# Patient Record
Sex: Male | Born: 1960 | Race: Black or African American | Hispanic: No | Marital: Single | Smoking: Never smoker
Health system: Southern US, Community
[De-identification: ages and names within clinical notes are randomized; demographics above are authoritative.]

## PROBLEM LIST (undated history)

## (undated) DIAGNOSIS — I10 Essential (primary) hypertension: Secondary | ICD-10-CM

## (undated) DIAGNOSIS — I519 Heart disease, unspecified: Secondary | ICD-10-CM

## (undated) DIAGNOSIS — B009 Herpesviral infection, unspecified: Secondary | ICD-10-CM

## (undated) DIAGNOSIS — Z973 Presence of spectacles and contact lenses: Secondary | ICD-10-CM

## (undated) DIAGNOSIS — E785 Hyperlipidemia, unspecified: Secondary | ICD-10-CM

## (undated) HISTORY — DX: Hyperlipidemia, unspecified: E78.5

## (undated) HISTORY — DX: Heart disease, unspecified: I51.9

## (undated) HISTORY — PX: WISDOM TOOTH EXTRACTION: SHX21

## (undated) HISTORY — PX: COLONOSCOPY: SHX174

## (undated) HISTORY — DX: Herpesviral infection, unspecified: B00.9

---
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
## 1997-09-16 HISTORY — PX: HERNIA REPAIR: SHX51

## 1997-12-15 ENCOUNTER — Encounter: Admission: RE | Admit: 1997-12-15 | Discharge: 1998-03-15 | Payer: Self-pay | Admitting: *Deleted

## 1998-01-05 ENCOUNTER — Ambulatory Visit (HOSPITAL_COMMUNITY): Admission: RE | Admit: 1998-01-05 | Discharge: 1998-01-05 | Payer: Self-pay | Admitting: *Deleted

## 1998-01-16 ENCOUNTER — Other Ambulatory Visit: Admission: RE | Admit: 1998-01-16 | Discharge: 1998-01-16 | Payer: Self-pay | Admitting: Hematology and Oncology

## 1998-03-06 ENCOUNTER — Other Ambulatory Visit: Admission: RE | Admit: 1998-03-06 | Discharge: 1998-03-06 | Payer: Self-pay | Admitting: Hematology and Oncology

## 1998-03-27 ENCOUNTER — Other Ambulatory Visit: Admission: RE | Admit: 1998-03-27 | Discharge: 1998-03-27 | Payer: Self-pay | Admitting: Hematology and Oncology

## 1998-05-27 ENCOUNTER — Encounter: Admission: RE | Admit: 1998-05-27 | Discharge: 1998-08-25 | Payer: Self-pay | Admitting: *Deleted

## 1998-05-31 ENCOUNTER — Inpatient Hospital Stay (HOSPITAL_COMMUNITY): Admission: EM | Admit: 1998-05-31 | Discharge: 1998-06-01 | Payer: Self-pay | Admitting: Emergency Medicine

## 1998-05-31 ENCOUNTER — Encounter: Payer: Self-pay | Admitting: Emergency Medicine

## 1999-08-06 ENCOUNTER — Emergency Department (HOSPITAL_COMMUNITY): Admission: EM | Admit: 1999-08-06 | Discharge: 1999-08-06 | Payer: Self-pay | Admitting: Emergency Medicine

## 2010-04-17 ENCOUNTER — Emergency Department (HOSPITAL_BASED_OUTPATIENT_CLINIC_OR_DEPARTMENT_OTHER): Admission: EM | Admit: 2010-04-17 | Discharge: 2010-04-18 | Payer: Self-pay | Admitting: Emergency Medicine

## 2010-04-17 ENCOUNTER — Ambulatory Visit: Payer: Self-pay | Admitting: Diagnostic Radiology

## 2010-04-18 ENCOUNTER — Ambulatory Visit: Payer: Self-pay | Admitting: Diagnostic Radiology

## 2010-11-30 LAB — DIFFERENTIAL
Eosinophils Absolute: 0.1 10*3/uL (ref 0.0–0.7)
Lymphocytes Relative: 32 % (ref 12–46)
Lymphs Abs: 1.9 10*3/uL (ref 0.7–4.0)
Monocytes Relative: 8 % (ref 3–12)
Neutrophils Relative %: 58 % (ref 43–77)

## 2010-11-30 LAB — CBC
HCT: 45.3 % (ref 39.0–52.0)
Hemoglobin: 15.3 g/dL (ref 13.0–17.0)
MCH: 30.3 pg (ref 26.0–34.0)
MCHC: 33.8 g/dL (ref 30.0–36.0)
MCV: 89.7 fL (ref 78.0–100.0)
Platelets: 186 10*3/uL (ref 150–400)
RBC: 5.05 MIL/uL (ref 4.22–5.81)
RDW: 13.3 % (ref 11.5–15.5)
WBC: 5.9 10*3/uL (ref 4.0–10.5)

## 2010-11-30 LAB — BASIC METABOLIC PANEL
BUN: 19 mg/dL (ref 6–23)
CO2: 27 mEq/L (ref 19–32)
Calcium: 10.3 mg/dL (ref 8.4–10.5)
Chloride: 105 mEq/L (ref 96–112)
Creatinine, Ser: 1.3 mg/dL (ref 0.4–1.5)
GFR calc Af Amer: >60 mL/min (ref >60)
GFR calc non Af Amer: 59 mL/min — ABNORMAL LOW (ref >60)
Glucose, Bld: 109 mg/dL — ABNORMAL HIGH (ref 70–99)
Potassium: 4.2 mEq/L (ref 3.5–5.1)
Sodium: 140 mEq/L (ref 135–145)

## 2010-11-30 LAB — POCT CARDIAC MARKERS
CKMB, poc: 2.7 ng/mL (ref 1.0–8.0)
Myoglobin, poc: 169 ng/mL (ref 12–200)
Myoglobin, poc: 188 ng/mL (ref 12–200)
Troponin i, poc: <0.05 ng/mL (ref 0.00–0.09)

## 2012-11-18 ENCOUNTER — Emergency Department (HOSPITAL_BASED_OUTPATIENT_CLINIC_OR_DEPARTMENT_OTHER)
Admission: EM | Admit: 2012-11-18 | Discharge: 2012-11-18 | Disposition: A | Discharge status: Home / Self Care | Payer: BC Managed Care – PPO | Attending: Emergency Medicine | Admitting: Emergency Medicine

## 2012-11-18 ENCOUNTER — Encounter (HOSPITAL_BASED_OUTPATIENT_CLINIC_OR_DEPARTMENT_OTHER): Payer: Self-pay | Admitting: *Deleted

## 2012-11-18 ENCOUNTER — Emergency Department (HOSPITAL_BASED_OUTPATIENT_CLINIC_OR_DEPARTMENT_OTHER): Payer: BC Managed Care – PPO

## 2012-11-18 DIAGNOSIS — I10 Essential (primary) hypertension: Secondary | ICD-10-CM | POA: Insufficient documentation

## 2012-11-18 DIAGNOSIS — R5381 Other malaise: Secondary | ICD-10-CM | POA: Insufficient documentation

## 2012-11-18 DIAGNOSIS — H539 Unspecified visual disturbance: Secondary | ICD-10-CM | POA: Insufficient documentation

## 2012-11-18 DIAGNOSIS — G510 Bell's palsy: Secondary | ICD-10-CM

## 2012-11-18 HISTORY — DX: Essential (primary) hypertension: I10

## 2012-11-18 LAB — COMPREHENSIVE METABOLIC PANEL
AST: 24 U/L (ref 0–37)
CO2: 27 mEq/L (ref 19–32)
Calcium: 11.4 mg/dL — ABNORMAL HIGH (ref 8.4–10.5)
Creatinine, Ser: 1.4 mg/dL — ABNORMAL HIGH (ref 0.50–1.35)
GFR calc non Af Amer: 57 mL/min — ABNORMAL LOW (ref >90)
Sodium: 137 mEq/L (ref 135–145)
Total Protein: 8.1 g/dL (ref 6.0–8.3)

## 2012-11-18 LAB — PROTIME-INR: INR: 1 (ref 0.00–1.49)

## 2012-11-18 LAB — CBC WITH DIFFERENTIAL/PLATELET
Basophils Absolute: 0 10*3/uL (ref 0.0–0.1)
Basophils Relative: 0 % (ref 0–1)
Eosinophils Absolute: 0.1 10*3/uL (ref 0.0–0.7)
Eosinophils Relative: 2 % (ref 0–5)
HCT: 46.6 % (ref 39.0–52.0)
Lymphocytes Relative: 37 % (ref 12–46)
MCH: 30.6 pg (ref 26.0–34.0)
MCHC: 35.4 g/dL (ref 30.0–36.0)
MCV: 86.5 fL (ref 78.0–100.0)
Monocytes Absolute: 0.6 10*3/uL (ref 0.1–1.0)
Platelets: 197 10*3/uL (ref 150–400)
RDW: 13.7 % (ref 11.5–15.5)

## 2012-11-18 LAB — TROPONIN I: Troponin I: <0.3 ng/mL (ref <0.30)

## 2012-11-18 LAB — APTT: aPTT: 31 seconds (ref 24–37)

## 2012-11-18 MED ORDER — ARTIFICIAL TEARS OP OINT: TOPICAL_OINTMENT | Freq: Three times a day (TID) | OPHTHALMIC | Status: DC

## 2012-11-18 MED ORDER — PREDNISONE 50 MG PO TABS: 50.0000 mg | ORAL_TABLET | Freq: Every day | ORAL | Status: DC

## 2012-11-18 MED ORDER — ACYCLOVIR 400 MG PO TABS: 400.0000 mg | ORAL_TABLET | Freq: Four times a day (QID) | ORAL | Status: DC

## 2012-11-18 NOTE — ED Provider Notes (Signed)
History     CSN: [id number]  Arrival date & time 11/18/12  1342   First MD Initiated Contact with Patient 11/18/12 1504      Chief Complaint  Patient presents with  . Numbness    (Consider location/radiation/quality/duration/timing/severity/associated sxs/prior treatment) HPI Pt p/w numbness and weakness to R face starting yesterday morning at 0600 upon waking. States was at his baseline when he went to bed. Pt states that he has had some mild R eye blurred vision and tingling in the tongue. No recent illness, no fever of chills, no ext weakness or numbness. Seen at urgent care center and sent for further eval.  Past Medical History  Diagnosis Date  . Hypertension     Past Surgical History  Procedure Laterality Date  . Hernia repair    . Eye surgery    . Wisdom tooth extraction      No family history on file.  History  Substance Use Topics  . Smoking status: Never Smoker   . Smokeless tobacco: Never Used  . Alcohol Use: 1.0 oz/week    2 drink(s) per week      Review of Systems  Constitutional: Negative for fever and chills.  HENT: Negative for ear pain, sore throat, trouble swallowing, voice change and tinnitus.   Eyes: Positive for visual disturbance. Negative for photophobia and pain.  Respiratory: Negative for cough and shortness of breath.   Cardiovascular: Negative for chest pain, palpitations and leg swelling.  Gastrointestinal: Negative for nausea, vomiting and abdominal pain.  Musculoskeletal: Negative for back pain.  Skin: Negative for rash and wound.  Neurological: Positive for facial asymmetry, weakness and numbness. Negative for speech difficulty and headaches.    Allergies  Review of patient's allergies indicates no known allergies.  Home Medications   Current Outpatient Rx  Name  Route  Sig  Dispense  Refill  . ibuprofen (ADVIL,MOTRIN) 800 MG tablet   Oral   Take 800 mg by mouth every 8 (eight) hours as needed for pain.         Marland Kitchen  acyclovir (ZOVIRAX) 400 MG tablet   Oral   Take 1 tablet (400 mg total) by mouth 4 (four) times daily.   40 tablet   0   . artificial tears (LACRILUBE) OINT ophthalmic ointment   Ophthalmic   Apply to eye every 8 (eight) hours.   1 Tube   0   . predniSONE (DELTASONE) 50 MG tablet   Oral   Take 1 tablet (50 mg total) by mouth daily.   5 tablet   0     BP 176/103  Pulse 62  Temp(Src) 98.2 F (36.8 C)  Resp 18  Ht 6\' 5"  (1.956 m)  Wt 317 lb (143.79 kg)  BMI 37.58 kg/m2  SpO2 100%  Physical Exam  Nursing note and vitals reviewed. Constitutional: He is oriented to person, place, and time. He appears well-developed and well-nourished. No distress.  HENT:  Head: Normocephalic and atraumatic.  Mouth/Throat: Oropharynx is clear and moist.  Eyes: EOM are normal. Pupils are equal, round, and reactive to light.  Neck: Normal range of motion. Neck supple.  No meningismus   Cardiovascular: Normal rate and regular rhythm.   Pulmonary/Chest: Effort normal and breath sounds normal. No respiratory distress. He has no wheezes. He has no rales.  Abdominal: Soft. Bowel sounds are normal. He exhibits no distension and no mass. There is no tenderness. There is no rebound and no guarding.  Musculoskeletal: Normal range of motion.  He exhibits no edema and no tenderness.  Neurological: He is alert and oriented to person, place, and time.  Mild R nasolabial fold flattening, R orbicularis oculi weakness, and R frontalis weakness. Pt has decreased sensation of upper and lower face. +R eye medial gaze deficit which pt states is previously present. 5/5 motor in all ext, sensation intact, finger to nose intact bl. Ambulating without difficulty.   Skin: Skin is warm and dry. No rash noted. No erythema.  Psychiatric: He has a normal mood and affect. His behavior is normal.    ED Course  Procedures (including critical care time)  Labs Reviewed  COMPREHENSIVE METABOLIC PANEL - Abnormal; Notable for  the following:    Creatinine, Ser 1.40 (*)    Calcium 11.4 (*)    GFR calc non Af Amer 57 (*)    GFR calc Af Amer 66 (*)    All other components within normal limits  CBC WITH DIFFERENTIAL  PROTIME-INR  TROPONIN I  APTT   Ct Head Wo Contrast  11/18/2012  *RADIOLOGY REPORT*  Clinical Data: Right facial numbness since yesterday evening.  CT HEAD WITHOUT CONTRAST  Technique:  Contiguous axial images were obtained from the base of the skull through the vertex without contrast.  Comparison: MRI brain 04/18/2010.  CT head 04/17/2010.  Findings: There is no evidence for acute infarction, intracranial hemorrhage, mass lesion, hydrocephalus, or extra-axial fluid. There is no atrophy or white matter disease.  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 The larger lesion on image 12 measures 9 mm, and the smaller lesion on image 15 measures 4 mm.  Neither show acute hemorrhage.  These are incidental findings, unchanged from 2011.  Calvarium is intact.  Paranasal sinuses and mastoids are clear.  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 Otherwise negative exam.   Original Report Authenticated By: Davonna Belling, M.D.      1. Facial paralysis/Bells palsy      Date: 11/18/2012  Rate: 58  Rhythm: sinus bradycardia  QRS Axis: normal  Intervals: normal  ST/T Wave abnormalities: nonspecific T wave changes  Conduction Disutrbances:none  Narrative Interpretation:   Old EKG Reviewed: unchanged    MDM   Discussed finding with Dr Roseanne Reno. Agrees that this is a peripheral palsy. Will d/c home and advise neuro f/u for persistent symptoms. Return for concerns or worsening symptoms.        Loren Racer, MD 11/18/12 [phone]

## 2012-11-18 NOTE — ED Notes (Signed)
Pt able to ambulate with a steady gait, c/o pain behind right eye but "not as bad as yesterday".

## 2012-11-18 NOTE — ED Notes (Signed)
Patient transported to CT 

## 2012-11-18 NOTE — ED Notes (Signed)
Pt reports sharp pain right side of head upon waking yesterday- also reports pain in right eye, blurred vision and numbness right side of face- seen at [facility] today and sent here for CT and further eval

## 2012-11-18 NOTE — ED Notes (Signed)
EDP Zackowski updated on pt status

## 2012-11-18 NOTE — ED Notes (Signed)
MD at bedside. 

## 2012-11-18 NOTE — ED Notes (Signed)
Pt d/c home with family- directed to pharmacy to pick prescriptions- speech clear, cao x 4 at time of d/c

## 2013-03-08 ENCOUNTER — Other Ambulatory Visit: Payer: Self-pay | Admitting: Family Medicine

## 2013-03-08 DIAGNOSIS — R2232 Localized swelling, mass and lump, left upper limb: Secondary | ICD-10-CM

## 2013-03-16 ENCOUNTER — Ambulatory Visit
Admission: RE | Admit: 2013-03-16 | Discharge: 2013-03-16 | Disposition: A | Discharge status: Home / Self Care | Payer: BC Managed Care – PPO | Source: Ambulatory Visit | Attending: Family Medicine | Admitting: Family Medicine

## 2013-03-16 DIAGNOSIS — R2232 Localized swelling, mass and lump, left upper limb: Secondary | ICD-10-CM

## 2013-03-16 MED ORDER — GADOBENATE DIMEGLUMINE 529 MG/ML IV SOLN
20.0000 mL | Freq: Once | INTRAVENOUS | Status: AC | PRN
  Administered 2013-03-16: 20 mL via INTRAVENOUS

## 2013-03-29 ENCOUNTER — Ambulatory Visit (INDEPENDENT_AMBULATORY_CARE_PROVIDER_SITE_OTHER): Payer: BC Managed Care – PPO | Admitting: Surgery

## 2013-03-29 ENCOUNTER — Other Ambulatory Visit (INDEPENDENT_AMBULATORY_CARE_PROVIDER_SITE_OTHER): Payer: Self-pay

## 2013-03-29 ENCOUNTER — Encounter (INDEPENDENT_AMBULATORY_CARE_PROVIDER_SITE_OTHER): Payer: Self-pay | Admitting: Surgery

## 2013-03-29 VITALS — BP 138/88 | HR 60 | Temp 97.4°F | Ht 77.0 in | Wt 316.6 lb

## 2013-03-29 DIAGNOSIS — E210 Primary hyperparathyroidism: Secondary | ICD-10-CM

## 2013-03-29 NOTE — Patient Instructions (Signed)
Parathyroidectomy A parathyroidectomy is surgery to remove one or more parathyroid glands. These glands produce a hormone (parathyroid hormone) that helps control the level of calcium in your body. The glands are very small, about the size of a pea. They are located in your neck, close to your thyroid gland and your Adam's apple. Most people (85%) have four parathyroid glands,some people may have one or two more than that. Hyperparathyroidism is when too much parathyroid hormone is being produced. Usually this is caused by one of the parathyroid glands becoming enlarged, but it can also be caused by more than one of the glands. Hyperparathyroidism is found during blood tests that show high calcium in the blood. Parathyroid hormone levels will also be elevated. Cancer also can cause hyperparathyroidism, but this is rare. For the most common type of hyperparathyroidism, the treatment is surgical removal of the parathyroid gland that is enlarged. For patients with kidney failure and hyperparathyroidism, other treatment will be tried before surgery is done on the parathyroid.  Many times x-ray studies are done to find out which parathyroid gland or glands is malfunctioning. The decision about the best treatment for hyperparathyroidism is between the patient, their primary doctor, an endocrinologist, and a surgeon experienced in parathyroid surgery. LET YOUR CAREGIVER KNOW ABOUT:  Any allergies.  All medications you are taking, including:  Herbs, eyedrops, over-the-counter medications and creams.  Blood thinners (anticoagulants), aspirin or other drugs that could affect blood clotting.  Use of steroids (by mouth or as creams).  Previous problems with anesthetics, including local anesthetics.  Possibility of pregnancy, if this applies.  Any history of blood clots.  Any history of bleeding or other blood problems.  Previous surgery.  Smoking history.  Other health problems. RISKS AND  COMPLICATIONS   Short-term possibilities include:  Excessive bleeding.  Pain.  Infection near the incision.  Slow healing.  Pooling of blood under the wound (hematoma).  Damage to nerves in your neck.  Blood clots.  Difficulty breathing. This is very rare. It also is almost always temporary.  Longer-term possibilities include:  Scarring.  Skin damage.  Damage to blood vessels in the area.  Need for additional surgery.  A hoarse or weak voice. This is usually temporary. It can be the result of nerve damage.  Development of hypoparathyroidism. This means you are not making enough parathyroid hormone. It is rare. If it occurs, you will need to take calcium supplements daily. BEFORE THE PROCEDURE  Sometimes the surgery is done on an outpatient basis. This means you could go home the same day as your surgery. Other times, people need to stay in the hospital overnight. Ask your surgeon what you should expect.  If your surgery will be an outpatient procedure, arrange for someone to drive you home after the surgery.  Two weeks before your surgery, stop using aspirin and non-steroidal anti-inflammatory drugs (NSAID's) for pain relief. This includes prescription drugs and over-the-counter drugs such as ibuprofen and naproxen. Also stop taking vitamin E.  If you take blood-thinners, ask your healthcare provider when you should stop taking them.  Do not eat or drink for about 8 hours before your surgery.  You might be asked to shower or wash with a special antibacterial soap before the procedure.  Arrive at least an hour before the surgery, or whenever your surgeon recommends. This will give you time to check in and fill out any needed paperwork. PROCEDURE  The preparation:  You will change into a hospital gown.  You   will be given an IV. A needle will be inserted in your arm. Medication will be able to flow directly into your body through this needle.  You might be given a  sedative to help you relax.  You will be given a drug that puts you to sleep during the surgery (general anesthetic).  The procedure:  Once you are asleep, the surgeon will make a small cut (incision) in your lower neck. Ask your surgeon where the incision will be.  The surgeon will look for the gland(s) that are not working well. Often a tissue sample from a gland is used to determine this.  Any glands that are not working well will be removed.  The surgeon will close the incision with stitches, often these are hidden under the skin. AFTER THE PROCEDURE  You will stay in a recovery area until the anesthesia has worn off. Your blood pressure and heart rate will be checked.  If your surgery was an outpatient procedure, you will go home the same day.  If you need to stay in the hospital, you will be moved to a hospital room. You will probably stay for two to three days. This will depend on how quickly you recover.  While you are in the hospital, your blood will be tested to check the calcium levels in your body. HOME CARE INSTRUCTIONS   Take any medication that your surgeon prescribes. Follow the directions carefully. Take all of the medication.  Ask your surgeon whether you can take over-the-counter medicines for pain, discomfort or fever. Do not take aspirin without permission from the surgeon. Aspirin increases the chances of bleeding.  Do not get the wound wet for the first few days after surgery (or until the surgeon tells you it is OK).  After this procedure, many patients may develop low calcium levels in the blood. It is critical that you see your medical caregiver to have this monitored and managed.     SEEK MEDICAL CARE IF:   You notice blood or fluid leaking from the wound, or it becomes red or swollen.  You have trouble breathing.  You have trouble speaking.  You become nauseous or throw up for more than two days after the surgery.  You have a fever or  persistent symptoms for more than 2-3 days. SEEK IMMEDIATE MEDICAL CARE IF:   Breathing becomes more difficult.  You have a fever and your symptoms suddenly get worse. Document Released: 11/29/2008 Document Revised: 08/19/2012 Document Reviewed: 11/29/2008 Exit[employer] Patient Information 2014 [employer].  

## 2013-03-29 NOTE — Progress Notes (Signed)
General Surgery [facility]  Chief Complaint  Patient presents with  . New Evaluation    eval hyperparathyroidism - referral from Dr. Casimiro Needle Hilts    HISTORY: Patient is a 52 year old male referred by his primary physician for evaluation of suspected primary hyperparathyroidism. Patient was noted to have elevated serum calcium levels ranging from 11.1-11.4. An intact PTH level was elevated at 151. Patient had been treated in the past for low vitamin D levels. He has not had any further diagnostic testing.  Patient has had no prior head or neck surgery. There is no family history of parathyroid disease or other endocrine neoplasm.  Past Medical History  Diagnosis Date  . Hypertension   . Hyperlipidemia     Current Outpatient Prescriptions  Medication Sig Dispense Refill  . acyclovir (ZOVIRAX) 400 MG tablet Take 1 tablet (400 mg total) by mouth 4 (four) times daily.  40 tablet  0  . benazepril-hydrochlorthiazide (LOTENSIN HCT) 10-12.5 MG per tablet       . lisinopril (PRINIVIL,ZESTRIL) 10 MG tablet        No current facility-administered medications for this visit.    No Known Allergies  Family History  Problem Relation Age of Onset  . Heart failure Father   . Cancer Maternal Grandmother     Ovarian  . Cancer Paternal Grandmother     Colon    History   Social History  . Marital Status: Single    Spouse Name: N/A    Number of Children: N/A  . Years of Education: N/A   Social History Main Topics  . Smoking status: Never Smoker   . Smokeless tobacco: Never Used  . Alcohol Use: 1.0 oz/week    2 drink(s) per week     Comment: monthly  . Drug Use: No  . Sexually Active: None   Other Topics Concern  . None   Social History Narrative  . None    REVIEW OF SYSTEMS - PERTINENT POSITIVES ONLY: Denies tremor. Denies palpitations. Mild to moderate fatigue. Denies bone and joint pain. No history of nephrolithiasis.  EXAM: Filed Vitals:   03/29/13 1136  BP: 138/88  Pulse: 60  Temp: 97.4 F (36.3 C)    HEENT: normocephalic; pupils equal and reactive; sclerae clear; dentition good; mucous membranes moist NECK:  No palpable nodules in the thyroid bed; symmetric on extension; no palpable anterior or posterior cervical lymphadenopathy; no supraclavicular masses; no tenderness CHEST: clear to auscultation bilaterally without rales, rhonchi, or wheezes CARDIAC: regular rate and rhythm without significant murmur; peripheral pulses are full EXT:  non-tender without edema; no deformity NEURO: no gross focal deficits; no sign of tremor   LABORATORY RESULTS: See [facility] ([facility]Epic) for most recent results  RADIOLOGY RESULTS: See [facility] ([facility]L-Epic) for most recent results  IMPRESSION: Hypercalcemia, probable primary hyperparathyroidism  PLAN: Patient and I discussed hyperparathyroidism at length. I provided him with written literature to review. We are going to obtain a nuclear medicine parathyroid scan and I have asked the patient to do a 24-hour urine collection for calcium. Once these studies are completed, he will return to the office to review the results and did discuss minimally invasive surgery.  Velora Heckler, MD, FACS General & Endocrine Surgery [facility]  Primary Care Physician: Lillia Carmel, MD

## 2013-03-29 NOTE — Addendum Note (Signed)
Addended by: Joanette Gula on: 03/29/2013 12:26 PM   Modules accepted: Orders

## 2013-03-31 ENCOUNTER — Other Ambulatory Visit (INDEPENDENT_AMBULATORY_CARE_PROVIDER_SITE_OTHER): Payer: Self-pay | Admitting: Surgery

## 2013-04-01 ENCOUNTER — Encounter (HOSPITAL_COMMUNITY)
Admission: RE | Admit: 2013-04-01 | Discharge: 2013-04-01 | Disposition: A | Discharge status: Home / Self Care | Payer: BC Managed Care – PPO | Source: Ambulatory Visit | Attending: Surgery | Admitting: Surgery

## 2013-04-01 DIAGNOSIS — E210 Primary hyperparathyroidism: Secondary | ICD-10-CM | POA: Insufficient documentation

## 2013-04-01 LAB — CALCIUM, URINE, 24 HOUR: Calcium, 24 hour urine: 475 mg/d — ABNORMAL HIGH (ref 100–250)

## 2013-04-01 MED ORDER — TECHNETIUM TC 99M SESTAMIBI GENERIC - CARDIOLITE
26.0000 | Freq: Once | INTRAVENOUS | Status: AC | PRN
  Administered 2013-04-01: 26 via INTRAVENOUS

## 2013-04-07 ENCOUNTER — Ambulatory Visit (INDEPENDENT_AMBULATORY_CARE_PROVIDER_SITE_OTHER): Payer: BC Managed Care – PPO | Admitting: Surgery

## 2013-04-07 ENCOUNTER — Telehealth (INDEPENDENT_AMBULATORY_CARE_PROVIDER_SITE_OTHER): Payer: Self-pay

## 2013-04-07 ENCOUNTER — Encounter (INDEPENDENT_AMBULATORY_CARE_PROVIDER_SITE_OTHER): Payer: Self-pay | Admitting: Surgery

## 2013-04-07 VITALS — BP 170/110 | HR 64 | Temp 98.2°F | Resp 14 | Ht 77.0 in | Wt 322.0 lb

## 2013-04-07 DIAGNOSIS — E210 Primary hyperparathyroidism: Secondary | ICD-10-CM

## 2013-04-07 NOTE — Progress Notes (Signed)
General Surgery - [facility]  Visit Diagnoses: 1. Hyperparathyroidism, primary     HISTORY: Patient is a 52-year-old male being evaluated for primary hyperparathyroidism. At my request he underwent a 24-hour urine collection for calcium. This was markedly elevated at 475 mg per day. This was consistent with primary hyperparathyroidism. Patient underwent nuclear medicine parathyroid scan on 04/01/2013. 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 Patient returns today to discuss these results and plan for surgical resection.  PERTINENT REVIEW OF SYSTEMS: See previous note  EXAM: HEENT: normocephalic; pupils equal and reactive; sclerae clear; dentition good; mucous membranes moist NECK:  No palpable nodularity in the thyroid bed; symmetric on extension; no palpable anterior or posterior cervical lymphadenopathy; no supraclavicular masses; no tenderness CHEST: clear to auscultation bilaterally without rales, rhonchi, or wheezes CARDIAC: regular rate and rhythm without significant murmur; peripheral pulses are full EXT:  non-tender without edema; no deformity NEURO: no gross focal deficits; no sign of tremor   IMPRESSION: Primary hyperparathyroidism, 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 We discussed the procedure of minimally invasive parathyroidectomy. We discussed the alternative procedure of neck exploration with 4 gland identification. We discussed the risk and benefits of each procedure. We discussed the possibility of a second gland adenoma and additional surgery. We discussed restrictions on his activities following the procedure.  I believe the patient is a good candidate for minimally invasive surgery. This can be performed as an outpatient. Patient understands and wishes to proceed. We will arrange for his procedure in the near future.  The risks and benefits of the procedure have  been discussed at length with the patient.  The patient understands the proposed procedure, potential alternative treatments, and the course of recovery to be expected.  All of the patient's questions have been answered at this time.  The patient wishes to proceed with surgery.  Glenn Christo M. Malene Blaydes, MD, FACS [facility] Office: [phone]    

## 2013-04-07 NOTE — Telephone Encounter (Signed)
Pt given my chart info. States he will sign up.

## 2013-04-07 NOTE — Patient Instructions (Signed)

## 2013-04-19 ENCOUNTER — Encounter (HOSPITAL_COMMUNITY): Payer: Self-pay

## 2013-04-26 ENCOUNTER — Other Ambulatory Visit: Payer: Self-pay | Admitting: Orthopedic Surgery

## 2013-04-26 NOTE — Pre-Procedure Instructions (Addendum)
Nathan Butler.  04/26/2013   Your procedure is scheduled on:  Tuesday, August 19th.  Report to [facility] at 10:15 AM.  Call this number if you have problems the morning of surgery: [phone]   Remember:   Do not eat food or drink liquids after midnight.   Take these medicines the morning of surgery with A SIP OF WATER: None  Stop taking Aspirin, Coumadin, Plavix, Effient and Herbal medications.  Do not take any NSAIDs ie: Ibuprofen,  Advil,Naproxen or any medication containing Aspirin.   Do not wear jewelry, make-up or nail polish.  Do not wear lotions, powders, or perfumes. You may  NOT wear deodorant.  Men may shave face and neck.  Do not bring valuables to the hospital.  [facility] is not responsible  for any belongings or valuables.  Contacts, dentures or bridgework may not be worn into surgery.  Leave suitcase in the car. After surgery it may be brought to your room.  For patients admitted to the hospital, checkout time is 11:00 AM the day of discharge.   Patients discharged the day of surgery will not be allowed to drive home.  Name and phone number of your driver: -   Special Instructions: Shower using CHG 2 nights before surgery and the night before surgery.  If you shower the day of surgery use CHG.  Use special wash - you have one bottle of CHG for all showers.  You should use approximately 1/3 of the bottle for each shower.   Please read over the following fact sheets that you were given: Pain Booklet, Coughing and Deep Breathing and Surgical Site Infection Prevention

## 2013-04-27 ENCOUNTER — Encounter (HOSPITAL_COMMUNITY): Payer: Self-pay

## 2013-04-27 ENCOUNTER — Encounter (HOSPITAL_COMMUNITY)
Admission: RE | Admit: 2013-04-27 | Discharge: 2013-04-27 | Disposition: A | Discharge status: Home / Self Care | Payer: BC Managed Care – PPO | Source: Ambulatory Visit | Attending: Surgery | Admitting: Surgery

## 2013-04-27 DIAGNOSIS — Z01818 Encounter for other preprocedural examination: Secondary | ICD-10-CM | POA: Insufficient documentation

## 2013-04-27 DIAGNOSIS — Z01812 Encounter for preprocedural laboratory examination: Secondary | ICD-10-CM | POA: Insufficient documentation

## 2013-04-27 LAB — COMPREHENSIVE METABOLIC PANEL
Alkaline Phosphatase: 67 U/L (ref 39–117)
BUN: 15 mg/dL (ref 6–23)
Chloride: 102 mEq/L (ref 96–112)
Creatinine, Ser: 1.39 mg/dL — ABNORMAL HIGH (ref 0.50–1.35)
GFR calc Af Amer: 66 mL/min — ABNORMAL LOW (ref >90)
Glucose, Bld: 97 mg/dL (ref 70–99)
Potassium: 4.4 mEq/L (ref 3.5–5.1)
Total Bilirubin: 0.3 mg/dL (ref 0.3–1.2)

## 2013-04-27 LAB — CBC
HCT: 47.2 % (ref 39.0–52.0)
Hemoglobin: 17 g/dL (ref 13.0–17.0)
MCHC: 36 g/dL (ref 30.0–36.0)
MCV: 84.9 fL (ref 78.0–100.0)
WBC: 7.6 10*3/uL (ref 4.0–10.5)

## 2013-04-27 NOTE — Progress Notes (Addendum)
Saw a cardio about 1-2 yr ago...had tests on heart--stress- per Dr. York Spaniel....having palpitations and was on BP meds at time...tests came out normal( inside chart).....but with current dx, he's having rare palpitations d/t hi calcium.

## 2013-04-30 NOTE — Progress Notes (Signed)
Quick Note:  These results are acceptable for scheduled surgery.  Loyce Klasen M. Egidio Lofgren, MD, FACS [facility] Office: [phone]   ______ 

## 2013-05-03 MED ORDER — DEXTROSE 5 % IV SOLN
3.0000 g | INTRAVENOUS | Status: AC
  Administered 2013-05-04: 3 g via INTRAVENOUS
  Filled 2013-05-03: qty 3000

## 2013-05-03 MED ORDER — CHLORHEXIDINE GLUCONATE 4 % EX LIQD: 60.0000 mL | Freq: Once | CUTANEOUS | Status: DC

## 2013-05-04 ENCOUNTER — Ambulatory Visit (HOSPITAL_COMMUNITY)
Admission: RE | Admit: 2013-05-04 | Discharge: 2013-05-04 | Disposition: A | Discharge status: Home / Self Care | Payer: BC Managed Care – PPO | Source: Ambulatory Visit | Attending: Surgery | Admitting: Surgery

## 2013-05-04 ENCOUNTER — Encounter (HOSPITAL_COMMUNITY): Payer: Self-pay | Admitting: Anesthesiology

## 2013-05-04 ENCOUNTER — Encounter (HOSPITAL_COMMUNITY): Payer: Self-pay

## 2013-05-04 ENCOUNTER — Encounter (HOSPITAL_COMMUNITY)
Admission: RE | Disposition: A | Discharge status: Home / Self Care | Payer: Self-pay | Source: Ambulatory Visit | Attending: Surgery

## 2013-05-04 ENCOUNTER — Ambulatory Visit (HOSPITAL_COMMUNITY): Payer: BC Managed Care – PPO | Admitting: Anesthesiology

## 2013-05-04 DIAGNOSIS — G510 Bell's palsy: Secondary | ICD-10-CM | POA: Insufficient documentation

## 2013-05-04 DIAGNOSIS — D351 Benign neoplasm of parathyroid gland: Secondary | ICD-10-CM

## 2013-05-04 DIAGNOSIS — E210 Primary hyperparathyroidism: Secondary | ICD-10-CM

## 2013-05-04 DIAGNOSIS — M6789 Other specified disorders of synovium and tendon, multiple sites: Secondary | ICD-10-CM | POA: Insufficient documentation

## 2013-05-04 DIAGNOSIS — R229 Localized swelling, mass and lump, unspecified: Secondary | ICD-10-CM | POA: Insufficient documentation

## 2013-05-04 DIAGNOSIS — G589 Mononeuropathy, unspecified: Secondary | ICD-10-CM | POA: Insufficient documentation

## 2013-05-04 DIAGNOSIS — I10 Essential (primary) hypertension: Secondary | ICD-10-CM | POA: Insufficient documentation

## 2013-05-04 HISTORY — PX: EXCISION METACARPAL MASS: SHX6372

## 2013-05-04 HISTORY — PX: PARATHYROIDECTOMY: SHX19

## 2013-05-04 SURGERY — PARATHYROIDECTOMY
Anesthesia: General | Site: Thumb | Laterality: Left | Wound class: Clean

## 2013-05-04 MED ORDER — ARTIFICIAL TEARS OP OINT
TOPICAL_OINTMENT | OPHTHALMIC | Status: DC | PRN
  Administered 2013-05-04: 1 via OPHTHALMIC

## 2013-05-04 MED ORDER — MEPERIDINE HCL 25 MG/ML IJ SOLN: 6.2500 mg | INTRAMUSCULAR | Status: DC | PRN

## 2013-05-04 MED ORDER — 0.9 % SODIUM CHLORIDE (POUR BTL) OPTIME
TOPICAL | Status: DC | PRN
  Administered 2013-05-04: 1000 mL

## 2013-05-04 MED ORDER — ROCURONIUM BROMIDE 100 MG/10ML IV SOLN
INTRAVENOUS | Status: DC | PRN
  Administered 2013-05-04: 50 mg via INTRAVENOUS

## 2013-05-04 MED ORDER — ONDANSETRON HCL 4 MG/2ML IJ SOLN: 4.0000 mg | Freq: Once | INTRAMUSCULAR | Status: DC | PRN

## 2013-05-04 MED ORDER — LACTATED RINGERS IV SOLN
INTRAVENOUS | Status: DC | PRN
  Administered 2013-05-04 (×2): via INTRAVENOUS

## 2013-05-04 MED ORDER — BUPIVACAINE HCL (PF) 0.25 % IJ SOLN
INTRAMUSCULAR | Status: AC
  Filled 2013-05-04: qty 30

## 2013-05-04 MED ORDER — ONDANSETRON HCL 4 MG/2ML IJ SOLN
INTRAMUSCULAR | Status: DC | PRN
  Administered 2013-05-04: 4 mg via INTRAVENOUS

## 2013-05-04 MED ORDER — HYDROMORPHONE HCL PF 1 MG/ML IJ SOLN
0.2500 mg | INTRAMUSCULAR | Status: DC | PRN
  Administered 2013-05-04: 0.5 mg via INTRAVENOUS

## 2013-05-04 MED ORDER — HEMOSTATIC AGENTS (NO CHARGE) OPTIME
TOPICAL | Status: DC | PRN
  Administered 2013-05-04: 1 via TOPICAL

## 2013-05-04 MED ORDER — SODIUM CHLORIDE 0.9 % IV SOLN
10.0000 mg | INTRAVENOUS | Status: DC | PRN
  Administered 2013-05-04: 5 ug/min via INTRAVENOUS

## 2013-05-04 MED ORDER — OXYCODONE HCL 5 MG PO TABS: 5.0000 mg | ORAL_TABLET | Freq: Once | ORAL | Status: DC | PRN

## 2013-05-04 MED ORDER — MIDAZOLAM HCL 5 MG/5ML IJ SOLN
INTRAMUSCULAR | Status: DC | PRN
  Administered 2013-05-04: 2 mg via INTRAVENOUS

## 2013-05-04 MED ORDER — GLYCOPYRROLATE 0.2 MG/ML IJ SOLN
INTRAMUSCULAR | Status: DC | PRN
  Administered 2013-05-04: 0.4 mg via INTRAVENOUS

## 2013-05-04 MED ORDER — LIDOCAINE HCL (CARDIAC) 20 MG/ML IV SOLN
INTRAVENOUS | Status: DC | PRN
  Administered 2013-05-04: 100 mg via INTRAVENOUS

## 2013-05-04 MED ORDER — BUPIVACAINE HCL (PF) 0.25 % IJ SOLN
INTRAMUSCULAR | Status: DC | PRN
  Administered 2013-05-04: 10 mL

## 2013-05-04 MED ORDER — HYDROCODONE-ACETAMINOPHEN 5-325 MG PO TABS: 1.0000 | ORAL_TABLET | ORAL | Status: DC | PRN

## 2013-05-04 MED ORDER — LACTATED RINGERS IV SOLN
INTRAVENOUS | Status: DC
  Administered 2013-05-04: 11:00:00 via INTRAVENOUS

## 2013-05-04 MED ORDER — LIDOCAINE HCL 4 % MT SOLN
OROMUCOSAL | Status: DC | PRN
  Administered 2013-05-04: 4 mL via TOPICAL

## 2013-05-04 MED ORDER — NEOSTIGMINE METHYLSULFATE 1 MG/ML IJ SOLN
INTRAMUSCULAR | Status: DC | PRN
  Administered 2013-05-04: 3 mg via INTRAVENOUS

## 2013-05-04 MED ORDER — BUPIVACAINE HCL (PF) 0.5 % IJ SOLN
INTRAMUSCULAR | Status: DC | PRN
  Administered 2013-05-04: 10 mL

## 2013-05-04 MED ORDER — FENTANYL CITRATE 0.05 MG/ML IJ SOLN
INTRAMUSCULAR | Status: DC | PRN
  Administered 2013-05-04: 100 ug via INTRAVENOUS
  Administered 2013-05-04 (×2): 50 ug via INTRAVENOUS
  Administered 2013-05-04: 150 ug via INTRAVENOUS

## 2013-05-04 MED ORDER — OXYCODONE HCL 5 MG/5ML PO SOLN: 5.0000 mg | Freq: Once | ORAL | Status: DC | PRN

## 2013-05-04 MED ORDER — HYDROMORPHONE HCL PF 1 MG/ML IJ SOLN
INTRAMUSCULAR | Status: AC
  Filled 2013-05-04: qty 1

## 2013-05-04 MED ORDER — PROPOFOL 10 MG/ML IV BOLUS
INTRAVENOUS | Status: DC | PRN
  Administered 2013-05-04: 150 mg via INTRAVENOUS

## 2013-05-04 SURGICAL SUPPLY — 72 items
APL SKNCLS STERI-STRIP NONHPOA (GAUZE/BANDAGES/DRESSINGS) ×2
BANDAGE CONFORM 2  STR LF (GAUZE/BANDAGES/DRESSINGS) ×1 IMPLANT
BANDAGE GAUZE ELAST BULKY 4 IN (GAUZE/BANDAGES/DRESSINGS) ×1 IMPLANT
BENZOIN TINCTURE PRP APPL 2/3 (GAUZE/BANDAGES/DRESSINGS) ×3 IMPLANT
BLADE SURG 15 STRL LF DISP TIS (BLADE) ×2 IMPLANT
BLADE SURG 15 STRL SS (BLADE) ×3
BNDG CMPR MD 5X2 ELC HKLP STRL (GAUZE/BANDAGES/DRESSINGS) ×2
BNDG ELASTIC 2 VLCR STRL LF (GAUZE/BANDAGES/DRESSINGS) ×1 IMPLANT
CANISTER SUCTION 2500CC (MISCELLANEOUS) ×3 IMPLANT
CHLORAPREP W/TINT 10.5 ML (MISCELLANEOUS) ×3 IMPLANT
CLIP TI MEDIUM 6 (CLIP) ×3 IMPLANT
CLIP TI WIDE RED SMALL 24 (CLIP) ×1 IMPLANT
CLIP TI WIDE RED SMALL 6 (CLIP) ×3 IMPLANT
CLOTH BEACON ORANGE TIMEOUT ST (SAFETY) ×3 IMPLANT
CLSR STERI-STRIP ANTIMIC 1/2X4 (GAUZE/BANDAGES/DRESSINGS) ×1 IMPLANT
CONT SPEC 4OZ CLIKSEAL STRL BL (MISCELLANEOUS) ×3 IMPLANT
CORDS BIPOLAR (ELECTRODE) ×1 IMPLANT
COVER SURGICAL LIGHT HANDLE (MISCELLANEOUS) ×3 IMPLANT
CUFF TOURNIQUET SINGLE 18IN (TOURNIQUET CUFF) ×1 IMPLANT
DRAPE EXTREMITY T 121X128X90 (DRAPE) ×1 IMPLANT
DRAPE PED LAPAROTOMY (DRAPES) ×3 IMPLANT
DRAPE PROXIMA HALF (DRAPES) ×1 IMPLANT
DRAPE UTILITY 15X26 W/TAPE STR (DRAPE) ×6 IMPLANT
DRSG EMULSION OIL 3X3 NADH (GAUZE/BANDAGES/DRESSINGS) ×1 IMPLANT
ELECT CAUTERY BLADE 6.4 (BLADE) ×3 IMPLANT
ELECT REM PT RETURN 9FT ADLT (ELECTROSURGICAL) ×3
ELECTRODE REM PT RTRN 9FT ADLT (ELECTROSURGICAL) ×2 IMPLANT
GAUZE SPONGE 2X2 8PLY STRL LF (GAUZE/BANDAGES/DRESSINGS) ×2 IMPLANT
GAUZE SPONGE 4X4 16PLY XRAY LF (GAUZE/BANDAGES/DRESSINGS) ×5 IMPLANT
GAUZE XEROFORM 1X8 LF (GAUZE/BANDAGES/DRESSINGS) ×1 IMPLANT
GLOVE BIO SURGEON STRL SZ7 (GLOVE) ×2 IMPLANT
GLOVE BIO SURGEON STRL SZ7.5 (GLOVE) ×1 IMPLANT
GLOVE BIOGEL PI IND STRL 6.5 (GLOVE) IMPLANT
GLOVE BIOGEL PI IND STRL 7.0 (GLOVE) IMPLANT
GLOVE BIOGEL PI INDICATOR 6.5 (GLOVE) ×1
GLOVE BIOGEL PI INDICATOR 7.0 (GLOVE) ×2
GLOVE SS BIOGEL STRL SZ 8 (GLOVE) IMPLANT
GLOVE SUPERSENSE BIOGEL SZ 8 (GLOVE) ×1
GLOVE SURG ORTHO 8.0 STRL STRW (GLOVE) ×3 IMPLANT
GOWN STRL NON-REIN LRG LVL3 (GOWN DISPOSABLE) ×8 IMPLANT
GOWN STRL REIN XL XLG (GOWN DISPOSABLE) ×4 IMPLANT
HEMOSTAT SURGICEL 2X4 FIBR (HEMOSTASIS) ×3 IMPLANT
KIT BASIN OR (CUSTOM PROCEDURE TRAY) ×3 IMPLANT
KIT ROOM TURNOVER OR (KITS) ×3 IMPLANT
NDL HYPO 25GX1X1/2 BEV (NEEDLE) IMPLANT
NEEDLE HYPO 25GX1X1/2 BEV (NEEDLE) ×6 IMPLANT
NS IRRIG 1000ML POUR BTL (IV SOLUTION) ×3 IMPLANT
PACK SURGICAL SETUP 50X90 (CUSTOM PROCEDURE TRAY) ×4 IMPLANT
PAD ARMBOARD 7.5X6 YLW CONV (MISCELLANEOUS) ×6 IMPLANT
PADDING CAST COTTON 2X4 NS (CAST SUPPLIES) ×1 IMPLANT
PENCIL BUTTON HOLSTER BLD 10FT (ELECTRODE) ×3 IMPLANT
SCRUB BETADINE 4OZ XXX (MISCELLANEOUS) ×1 IMPLANT
SOLUTION BETADINE 4OZ (MISCELLANEOUS) ×1 IMPLANT
SPLINT FIBERGLASS 3X12 (CAST SUPPLIES) ×1 IMPLANT
SPONGE GAUZE 2X2 STER 10/PKG (GAUZE/BANDAGES/DRESSINGS)
SPONGE GAUZE 4X4 12PLY (GAUZE/BANDAGES/DRESSINGS) ×2 IMPLANT
SPONGE INTESTINAL PEANUT (DISPOSABLE) ×1 IMPLANT
SPONGE LAP 18X18 X RAY DECT (DISPOSABLE) ×1 IMPLANT
STRIP CLOSURE SKIN 1/2X4 (GAUZE/BANDAGES/DRESSINGS) ×3 IMPLANT
SUT MNCRL AB 4-0 PS2 18 (SUTURE) ×3 IMPLANT
SUT PROLENE 4 0 P 3 18 (SUTURE) ×1 IMPLANT
SUT SILK 2 0 (SUTURE) ×3
SUT SILK 2-0 18XBRD TIE 12 (SUTURE) IMPLANT
SUT SILK 3 0 (SUTURE) ×3
SUT SILK 3-0 18XBRD TIE 12 (SUTURE) IMPLANT
SUT VIC AB 3-0 SH 18 (SUTURE) ×3 IMPLANT
SYR CONTROL 10ML LL (SYRINGE) ×2 IMPLANT
TAPE CLOTH SURG 4X10 WHT LF (GAUZE/BANDAGES/DRESSINGS) ×1 IMPLANT
TOWEL OR 17X24 6PK STRL BLUE (TOWEL DISPOSABLE) ×4 IMPLANT
TOWEL OR 17X26 10 PK STRL BLUE (TOWEL DISPOSABLE) ×4 IMPLANT
TUBE CONNECTING 12X1/4 (SUCTIONS) ×3 IMPLANT
TUBE SUCT ARGYLE STRL (TUBING) ×1 IMPLANT

## 2013-05-04 NOTE — Anesthesia Preprocedure Evaluation (Addendum)
Anesthesia Evaluation  Patient identified by MRN, date of birth, ID band Patient awake    Reviewed: Allergy & Precautions, H&P , NPO status , Patient's Chart, lab work & pertinent test results  Airway Mallampati: I TM Distance: >3 FB Neck ROM: Full    Dental  (+) Teeth Intact   Pulmonary    Pulmonary exam normal       Cardiovascular hypertension, Pt. on medications Rhythm:Regular Rate:Normal     Neuro/Psych    GI/Hepatic   Endo/Other    Renal/GU      Musculoskeletal   Abdominal Normal abdominal exam  (+)   Peds  Hematology   Anesthesia Other Findings Bells Palsy  Reproductive/Obstetrics                           Anesthesia Physical Anesthesia Plan  ASA: II  Anesthesia Plan: General   Post-op Pain Management:    Induction: Intravenous  Airway Management Planned: Oral ETT  Additional Equipment:   Intra-op Plan:   Post-operative Plan: Extubation in OR  Informed Consent: I have reviewed the patients History and Physical, chart, labs and discussed the procedure including the risks, benefits and alternatives for the proposed anesthesia with the patient or authorized representative who has indicated his/her understanding and acceptance.   Dental advisory given  Plan Discussed with: CRNA and Surgeon  Anesthesia Plan Comments:        Anesthesia Quick Evaluation

## 2013-05-04 NOTE — Consult Note (Signed)
  Plan left thumb mass excision  .Marland KitchenThe patient is alert and oriented in no acute distress the patient complains of pain in the affected upper extremity.  The patient is noted to have a normal HEENT exam.  Lung fields show equal chest expansion and no shortness of breath  abdomen exam is nontender without distention.  Lower extremity examination does not show any fracture dislocation or blood clot symptoms.  Pelvis is stable neck and back are stable and nontender  Left thumb mass is present and painful  .Marland KitchenWe are planning surgery for your upper extremity. The risk and benefits of surgery include risk of bleeding infection anesthesia damage to normal structures and failure of the surgery to accomplish its intended goals of relieving symptoms and restoring function with this in mind we'll going to proceed. I have specifically discussed with the patient the pre-and postoperative regime and the does and don'ts and risk and benefits in great detail. Risk and benefits of surgery also include risk of dystrophy chronic nerve pain failure of the healing process to go onto completion and other inherent risks of surgery The relavent the pathophysiology of the disease/injury process, as well as the alternatives for treatment and postoperative course of action has been discussed in great detail with the patient who desires to proceed.  We will do everything in our power to help you (the patient) restore function to the upper extremity. Is a pleasure to see this patient today.

## 2013-05-04 NOTE — Anesthesia Postprocedure Evaluation (Signed)
Anesthesia Post Note  Patient: Nathan Butler.  Procedure(s) Performed: Procedure(s) (LRB): inferior PARATHYROIDECTOMY (Left) LEFT THUMB MASS EXCISION, FLEXOR TENOLYSIS, ULNAR DIGITAL NERVE NEUROLYSIS (Left)  Anesthesia type: general  Patient location: PACU  Post pain: Pain level controlled  Post assessment: Patient's Cardiovascular Status Stable  Last Vitals:  Filed Vitals:   05/04/13 1701  BP: 177/100  Pulse: 53  Temp: 35.9 C  Resp:     Post vital signs: Reviewed and stable  Level of consciousness: sedated  Complications: No apparent anesthesia complications

## 2013-05-04 NOTE — Op Note (Signed)
See Dictation #[id number] Wendall Stade MD

## 2013-05-04 NOTE — Interval H&P Note (Signed)
History and Physical Interval Note:  05/04/2013 11:25 AM  Nathan Butler.  has presented today for surgery, with the diagnosis of primary hyperparathyroidism.  The various methods of treatment have been discussed with the patient and family. After consideration of risks, benefits and other options for treatment, the patient has consented to    Procedure(s): inferior PARATHYROIDECTOMY (Left) LEFT THUMB MASS EXCISION, NEUROLYSIS, TENOLYSIS/REPAIR AS NECESSARY (Left) as a surgical intervention .    The patient's history has been reviewed, patient examined, no change in status, stable for surgery.  I have reviewed the patient's chart and labs.  Questions were answered to the patient's satisfaction.    Velora Heckler, MD, [facility] Office: [phone]    Ivelise Castillo Judie Petit

## 2013-05-04 NOTE — H&P (View-Only) (Signed)
General Surgery [facility]  Visit Diagnoses: 1. Hyperparathyroidism, primary     HISTORY: Patient is a 52 year old male being evaluated for primary hyperparathyroidism. At my request he underwent a 24-hour urine collection for calcium. This was markedly elevated at 475 mg per day. This was consistent with primary hyperparathyroidism. Patient underwent nuclear medicine parathyroid scan on 04/01/2013. 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 Patient returns today to discuss these results and plan for surgical resection.  PERTINENT REVIEW OF SYSTEMS: See previous note  EXAM: HEENT: normocephalic; pupils equal and reactive; sclerae clear; dentition good; mucous membranes moist NECK:  No palpable nodularity in the thyroid bed; symmetric on extension; no palpable anterior or posterior cervical lymphadenopathy; no supraclavicular masses; no tenderness CHEST: clear to auscultation bilaterally without rales, rhonchi, or wheezes CARDIAC: regular rate and rhythm without significant murmur; peripheral pulses are full EXT:  non-tender without edema; no deformity NEURO: no gross focal deficits; no sign of tremor   IMPRESSION: Primary hyperparathyroidism, 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 We discussed the procedure of minimally invasive parathyroidectomy. We discussed the alternative procedure of neck exploration with 4 gland identification. We discussed the risk and benefits of each procedure. We discussed the possibility of a second gland adenoma and additional surgery. We discussed restrictions on his activities following the procedure.  I believe the patient is a good candidate for minimally invasive surgery. This can be performed as an outpatient. Patient understands and wishes to proceed. We will arrange for his procedure in the near future.  The risks and benefits of the procedure have  been discussed at length with the patient.  The patient understands the proposed procedure, potential alternative treatments, and the course of recovery to be expected.  All of the patient's questions have been answered at this time.  The patient wishes to proceed with surgery.  Velora Heckler, MD, [facility] Office: [phone]

## 2013-05-04 NOTE — Preoperative (Signed)
Beta Blockers   Reason not to administer Beta Blockers:Not Applicable 

## 2013-05-04 NOTE — Op Note (Signed)
OPERATIVE REPORT - PARATHYROIDECTOMY  Preoperative diagnosis: Primary hyperparathyroidism  Postop diagnosis: Same  Procedure: Left inferior minimally invasive parathyroidectomy  Surgeon:  Velora Heckler, MD, FACS  Anesthesia: Gen. endotracheal  Estimated blood loss: Minimal  Preparation: ChloraPrep  Indications: Patient with elevated calcium level and PTH level. Nuclear medicine parathyroid scan positive for adenoma in the left inferior position.  Procedure: Patient was prepared in the holding area. He was brought to operating room and placed in a supine position on the operating room table. Following administration of general anesthesia, the patient was positioned and then prepped and draped in the usual strict aseptic fashion. After ascertaining that an adequate level of anesthesia been achieved, a neck incision is made with a #15 blade. Dissection was carried through subcutaneous tissues and platysma. Hemostasis was obtained with the electrocautery. Skin flaps were developed circumferentially and a Weitlander retractor was placed for exposure.  Strap muscles were incised in the midline. Strap muscles were reflected exposing the thyroid lobe. With gentle blunt dissection the thyroid lobe was mobilized.  Dissection was carried through adipose tissue and an enlarged parathyroid gland is identified in the left thyrothymic tract. It is gently mobilized. Vascular structures are divided between small and medium ligaclips. Care is taken to avoid the recurrent laryngeal nerve and the esophagus. Gland is completely excised. It is submitted to pathology. Frozen section confirms parathyroid tissue consistent with adenoma.  Neck is irrigated with warm saline. Good hemostasis is noted. Fibrillar is placed in the operative field. Strap muscles are reapproximated in the midline with interrupted 3-0 Vicryl sutures. Platysma was closed with interrupted 3-0 Vicryl sutures. Skin is closed with a running 4-0  Monocryl subcuticular suture. Local anesthetic is infiltrated circumferentially. Wound was washed and dried and benzoin and Steri-Strips are applied. Sterile gauze dressing is applied. The patient tolerated the procedure well.   Velora Heckler, MD, FACS General & Endocrine Surgery [facility]

## 2013-05-04 NOTE — Anesthesia Procedure Notes (Signed)
Procedure Name: Intubation Date/Time: 05/04/2013 1:18 PM Performed by: Armandina Gemma Pre-anesthesia Checklist: Patient identified, Timeout performed, Emergency Drugs available, Suction available and Patient being monitored Patient Re-evaluated:Patient Re-evaluated prior to inductionOxygen Delivery Method: Circle system utilized Preoxygenation: Pre-oxygenation with 100% oxygen Intubation Type: IV induction Ventilation: Mask ventilation without difficulty Laryngoscope Size: Miller and 2 Grade View: Grade I Tube type: Oral Tube size: 7.5 mm Number of attempts: 1 Airway Equipment and Method: Stylet Placement Confirmation: ETT inserted through vocal cords under direct vision,  breath sounds checked- equal and bilateral and positive ETCO2 Secured at: 22 cm Tube secured with: Tape Dental Injury: Teeth and Oropharynx as per pre-operative assessment  Comments: IV induction Ossey -intubation AMM CRNA- atraumatic teeth and mouth as preop

## 2013-05-04 NOTE — Transfer of Care (Signed)
Immediate Anesthesia Transfer of Care Note  Patient: Nathan Butler.  Procedure(s) Performed: Procedure(s): inferior PARATHYROIDECTOMY (Left) LEFT THUMB MASS EXCISION, FLEXOR TENOLYSIS, ULNAR DIGITAL NERVE NEUROLYSIS (Left)  Patient Location: PACU  Anesthesia Type:General  Level of Consciousness: awake, alert  and oriented  Airway & Oxygen Therapy: Patient Spontanous Breathing and Patient connected to face mask oxygen  Post-op Assessment: Report given to PACU RN  Post vital signs: Reviewed and stable  Complications: No apparent anesthesia complications

## 2013-05-05 ENCOUNTER — Telehealth (INDEPENDENT_AMBULATORY_CARE_PROVIDER_SITE_OTHER): Payer: Self-pay

## 2013-05-05 ENCOUNTER — Encounter (HOSPITAL_COMMUNITY): Payer: Self-pay | Admitting: Surgery

## 2013-05-05 ENCOUNTER — Other Ambulatory Visit (INDEPENDENT_AMBULATORY_CARE_PROVIDER_SITE_OTHER): Payer: Self-pay

## 2013-05-05 NOTE — Telephone Encounter (Signed)
Pt home doing well. Lab slip mailed to pt. 

## 2013-05-05 NOTE — Op Note (Signed)
NAME:  Nathan Butler, Nathan Butler NO.:  [id number]  MEDICAL RECORD NO.:  [id number]  LOCATION:  [facility]                         FACILITY:  [facility]  PHYSICIAN:  Dionne Ano. Lonette Stevison, M.D.DATE OF BIRTH:  [birth year]  DATE OF PROCEDURE: DATE OF DISCHARGE:  05/04/2013                              OPERATIVE REPORT   PREOPERATIVE DIAGNOSIS:  Left thumb mass, deep in location.  POSTOPERATIVE DIAGNOSIS:  Deep thumb mass, consistent with a giant cell tumor visually, wrapped around the flexor sheath and against the periosteum of the bone, ulnar in location.  SURGICAL PROCEDURES PERFORMED: 1. Deep mass, 3 x 3 cm in size roughly, removal from the thumb. 2. Flexor pollicis longus, extensor tenolysis, tenosynovectomy, left     thumb. 3. Ulnar digital nerve and neurolysis with ulnar digital artery     exploration. 4. Superficial radial nerve neurolysis.  SURGEON:  Dionne Ano. Amanda Pea, MD  ASSISTANT:  Karie Chimera, PA-C  COMPLICATIONS:  None.  ANESTHESIA:  General.  INDICATIONS:  A pleasant male with a large mass about his thumb.  He understands risks, benefits, and desires to proceed.  All questions were encouraged and answered preoperatively.  He understands risk of recurrent infection and other issues.  OPERATION IN DETAIL:  The patient was seen by myself and Anesthesia, taken to the operating suite.  I signed the consent once entering the room as Dr. Gerrit Friends had removed the parathyroid tumor from his anterior neck region.  Following calling the second time-out for the thumb, he was prepped and draped in usual sterile fashion, Betadine scrub and paint.  Once this was done, arm was elevated and tourniquet was insufflated to 350 mmHg.  He was given 3 g of Ancef preoperatively. Curvilinear incision was made about the ulnar aspect of the thumb. Dissection was carried down to superficial radial nerve, underwent distinct and separate neurolysis as it was densely adherent to the mass. The  mass was then circumferentially identified.  I had to perform an ulnar digital artery dissection and ulnar digital nerve neurolysis extensive in nature to sweep it away from the mass in question. Following this, I removed the mass from the flexor tendon sheath region.  I should note the mass was deeply imbedded in the sheath, had elements in the sheath, thus I opened up the flexor pollicis longus sheath and performed an extensive tenolysis and tenosynovectomy to try and remove every bit of the disease, as these have a high risk of recurrence.  We will confirm his diagnosis with pathologic specimen.  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  The patient tolerated this well.  There were no complicating features. I deflated the tourniquet, secured hemostasis, closed the wound with Prolene.  Thumb spica splint was applied.  Mr. Wynona Neat and myself very carefully applied the splint and Sensorcaine without epinephrine for postop analgesia.  He tolerated the procedure well.  He will be monitored and see me back in 12 days.  I went over all issues with he and his family, and all questions have been encouraged and answered.     Dionne Ano. Amanda Pea, M.D.     [facility]  D:  05/04/2013  T:  05/05/2013  Job:  [id number]

## 2013-05-18 LAB — CALCIUM: Calcium: 9.1 mg/dL (ref 8.4–10.5)

## 2013-05-19 ENCOUNTER — Ambulatory Visit (INDEPENDENT_AMBULATORY_CARE_PROVIDER_SITE_OTHER): Payer: BC Managed Care – PPO | Admitting: Surgery

## 2013-05-19 ENCOUNTER — Encounter (INDEPENDENT_AMBULATORY_CARE_PROVIDER_SITE_OTHER): Payer: Self-pay | Admitting: Surgery

## 2013-05-19 VITALS — BP 150/100 | HR 64 | Temp 98.3°F | Resp 14 | Ht 77.0 in | Wt 319.2 lb

## 2013-05-19 DIAGNOSIS — E210 Primary hyperparathyroidism: Secondary | ICD-10-CM

## 2013-05-19 NOTE — Addendum Note (Signed)
Addended by: Joanette Gula on: 05/19/2013 11:41 AM   Modules accepted: Orders

## 2013-05-19 NOTE — Progress Notes (Signed)
General Surgery [facility]  Chief Complaint  Patient presents with  . Routine Post Op    left inferior parathyroidectomy 05/04/2013    HISTORY: Patient returns for first postoperative visit having undergone minimally invasive parathyroidectomy on 05/04/2013. Final pathology shows a parathyroid adenoma weighing 2900 mg and measuring 2.7 cm in length. Followup serum calcium level is now normal at 9.1 compared to his preoperative level of 11.4.  EXAM: Surgical incision is healing nicely. Mild soft tissue swelling. No sign of seroma. No sign of infection. Voice quality is normal.  IMPRESSION: Status post minimally invasive parathyroidectomy  PLAN: Patient will begin applying topical creams to his incisions. He will return in 6 weeks for final wound check. We will check an intact PTH level and serum calcium level prior to that office visit.  Velora Heckler, MD, FACS General & Endocrine Surgery [facility]   Visit Diagnoses: 1. Hyperparathyroidism, primary

## 2013-07-12 ENCOUNTER — Encounter (INDEPENDENT_AMBULATORY_CARE_PROVIDER_SITE_OTHER): Payer: BC Managed Care – PPO | Admitting: Surgery

## 2013-07-21 ENCOUNTER — Ambulatory Visit (INDEPENDENT_AMBULATORY_CARE_PROVIDER_SITE_OTHER): Payer: BC Managed Care – PPO | Admitting: Surgery

## 2013-07-21 ENCOUNTER — Encounter (INDEPENDENT_AMBULATORY_CARE_PROVIDER_SITE_OTHER): Payer: Self-pay | Admitting: Surgery

## 2013-07-21 VITALS — BP 148/90 | HR 60 | Temp 98.9°F | Resp 14 | Ht 77.0 in | Wt 326.8 lb

## 2013-07-21 DIAGNOSIS — E210 Primary hyperparathyroidism: Secondary | ICD-10-CM

## 2013-07-21 LAB — PTH, INTACT AND CALCIUM
Calcium: 9.1 mg/dL (ref 8.4–10.5)
PTH: 63.1 pg/mL (ref 14.0–72.0)

## 2013-07-21 NOTE — Patient Instructions (Signed)
Apply hydrocortisone cream to incision once daily for the next few months.   COCOA BUTTER & VITAMIN E CREAM  ([employer] or other brand)  Apply cocoa butter/vitamin E cream to your incision 2 - 3 times daily.  Massage cream into incision for one minute with each application.  Use sunscreen (50 SPF or higher) for first 6 months after surgery if area is exposed to sun.  You may substitute Mederma or other scar reducing creams as desired.    Velora Heckler, MD, [facility] Office: [phone]

## 2013-07-21 NOTE — Progress Notes (Signed)
General Surgery [facility]  Chief Complaint  Patient presents with  . Routine Post Op    parathyroidectomy 05/04/2013    HISTORY: Patient is a 52 year old male who underwent parathyroidectomy for primary hyperparathyroidism in August 2014. Postoperative course has been uneventful. His most recent laboratory studies were obtained yesterday and the results have not yet been reported.  Overall the patient feels well. He notes improved energy. He notes that his weakness has resolved. He has no complaints and no changes in his voice quality.  EXAM: Surgical incision is well-healed. Slight keloid formation. Voice quality is normal.  IMPRESSION: Status post minimally invasive parathyroidectomy  PLAN: Patient will continue to apply topical creams to his incision. I have recommended topical hydrocortisone cream once daily for the next few months to help with keloid formation.  Patient will return for surgical care as needed.  Velora Heckler, MD, FACS General & Endocrine Surgery [facility]   Visit Diagnoses: 1. Hyperparathyroidism, primary

## 2013-07-22 ENCOUNTER — Telehealth (INDEPENDENT_AMBULATORY_CARE_PROVIDER_SITE_OTHER): Payer: Self-pay

## 2013-07-22 ENCOUNTER — Other Ambulatory Visit: Payer: Self-pay

## 2013-07-22 NOTE — Telephone Encounter (Signed)
Per Dr Ardine Eng request pt advised that lab results are good.

## 2013-11-14 IMAGING — CR DG CHEST 2V
2 series · 2 of 2 positions shown · non-contrast
Comparison: 04/17/2010

CLINICAL DATA: Presurgical workup. Left inferior
parathyroidectomy.Left thumb mass excision.  History of
hypertension.

CHEST - 2 VIEW

[w chest pa]
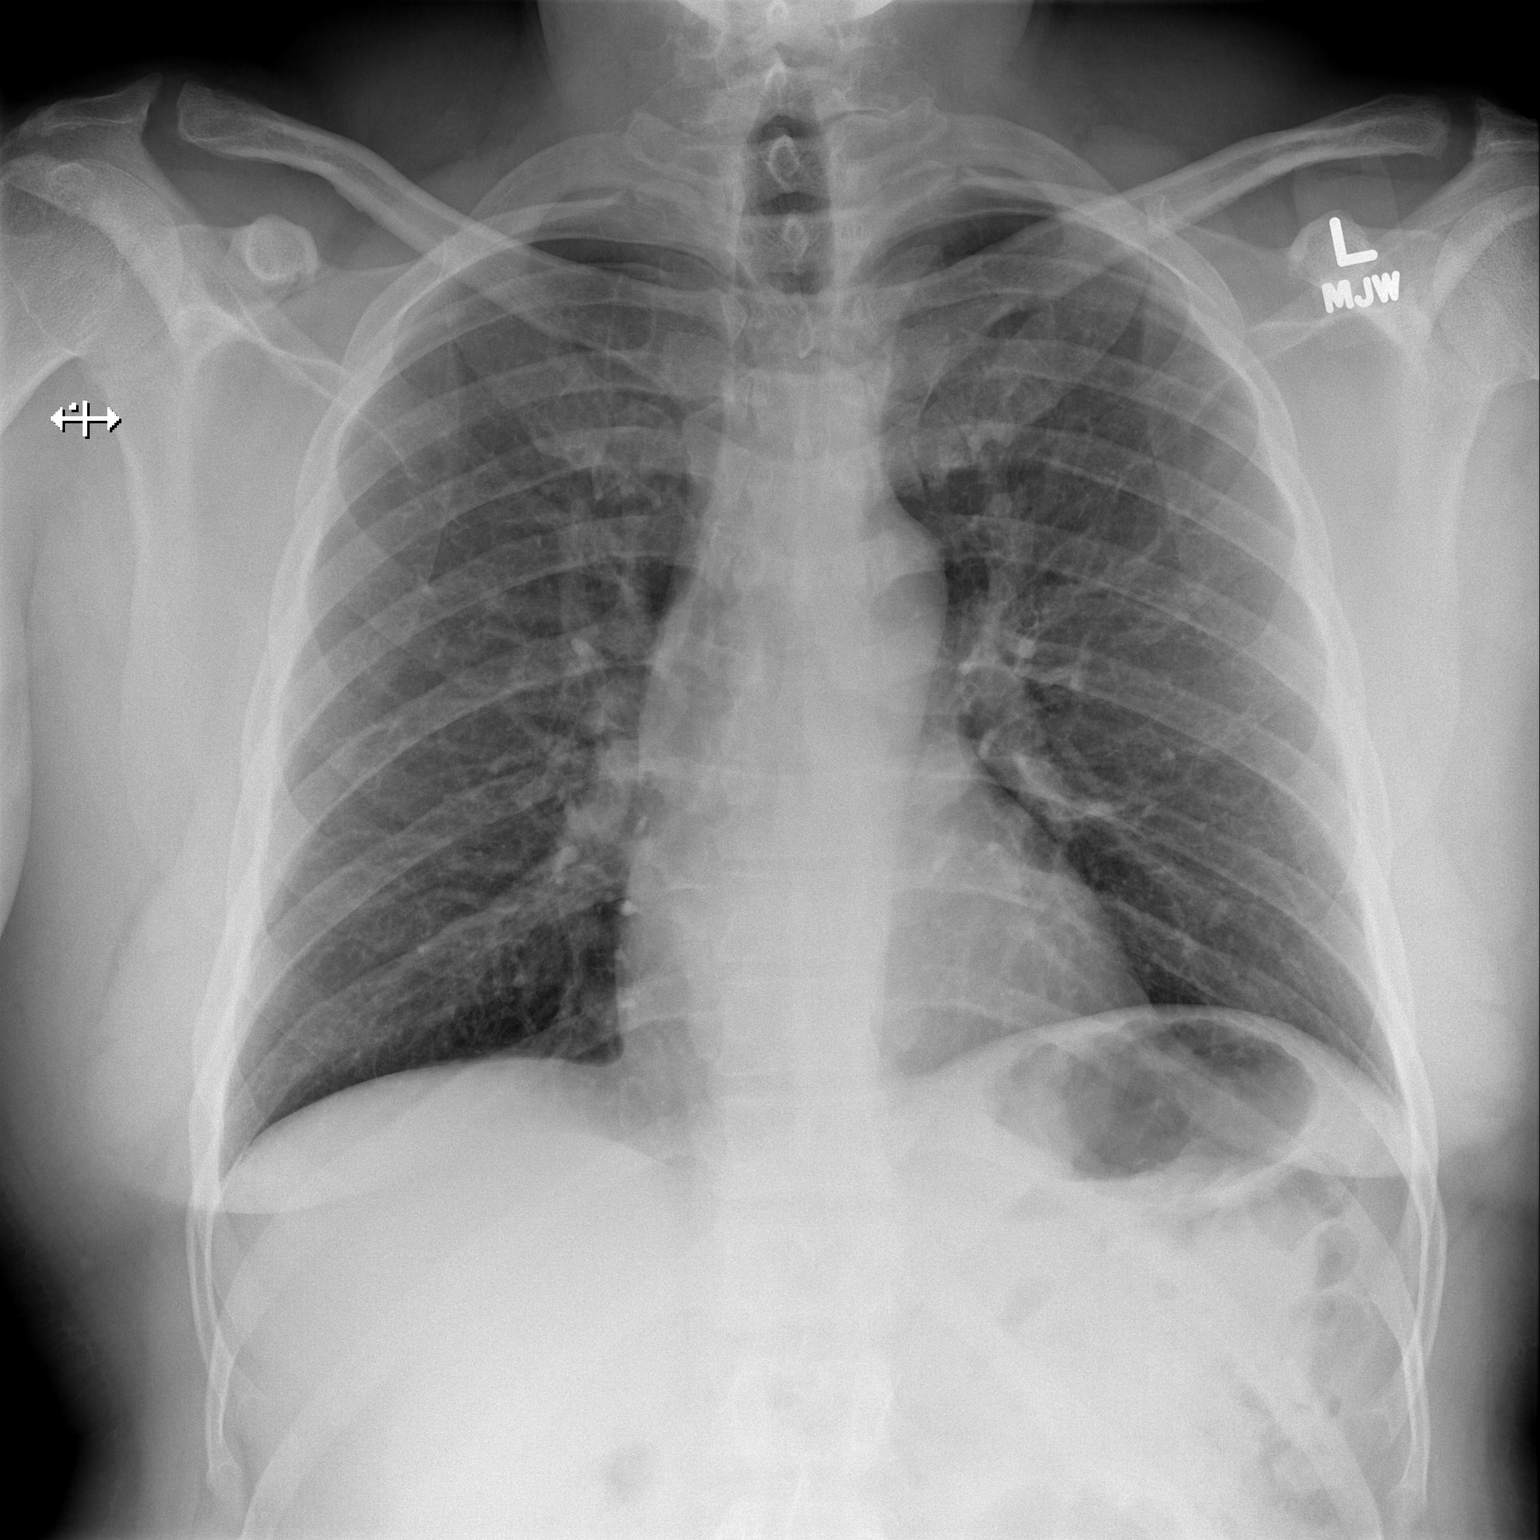

[w chest lat]
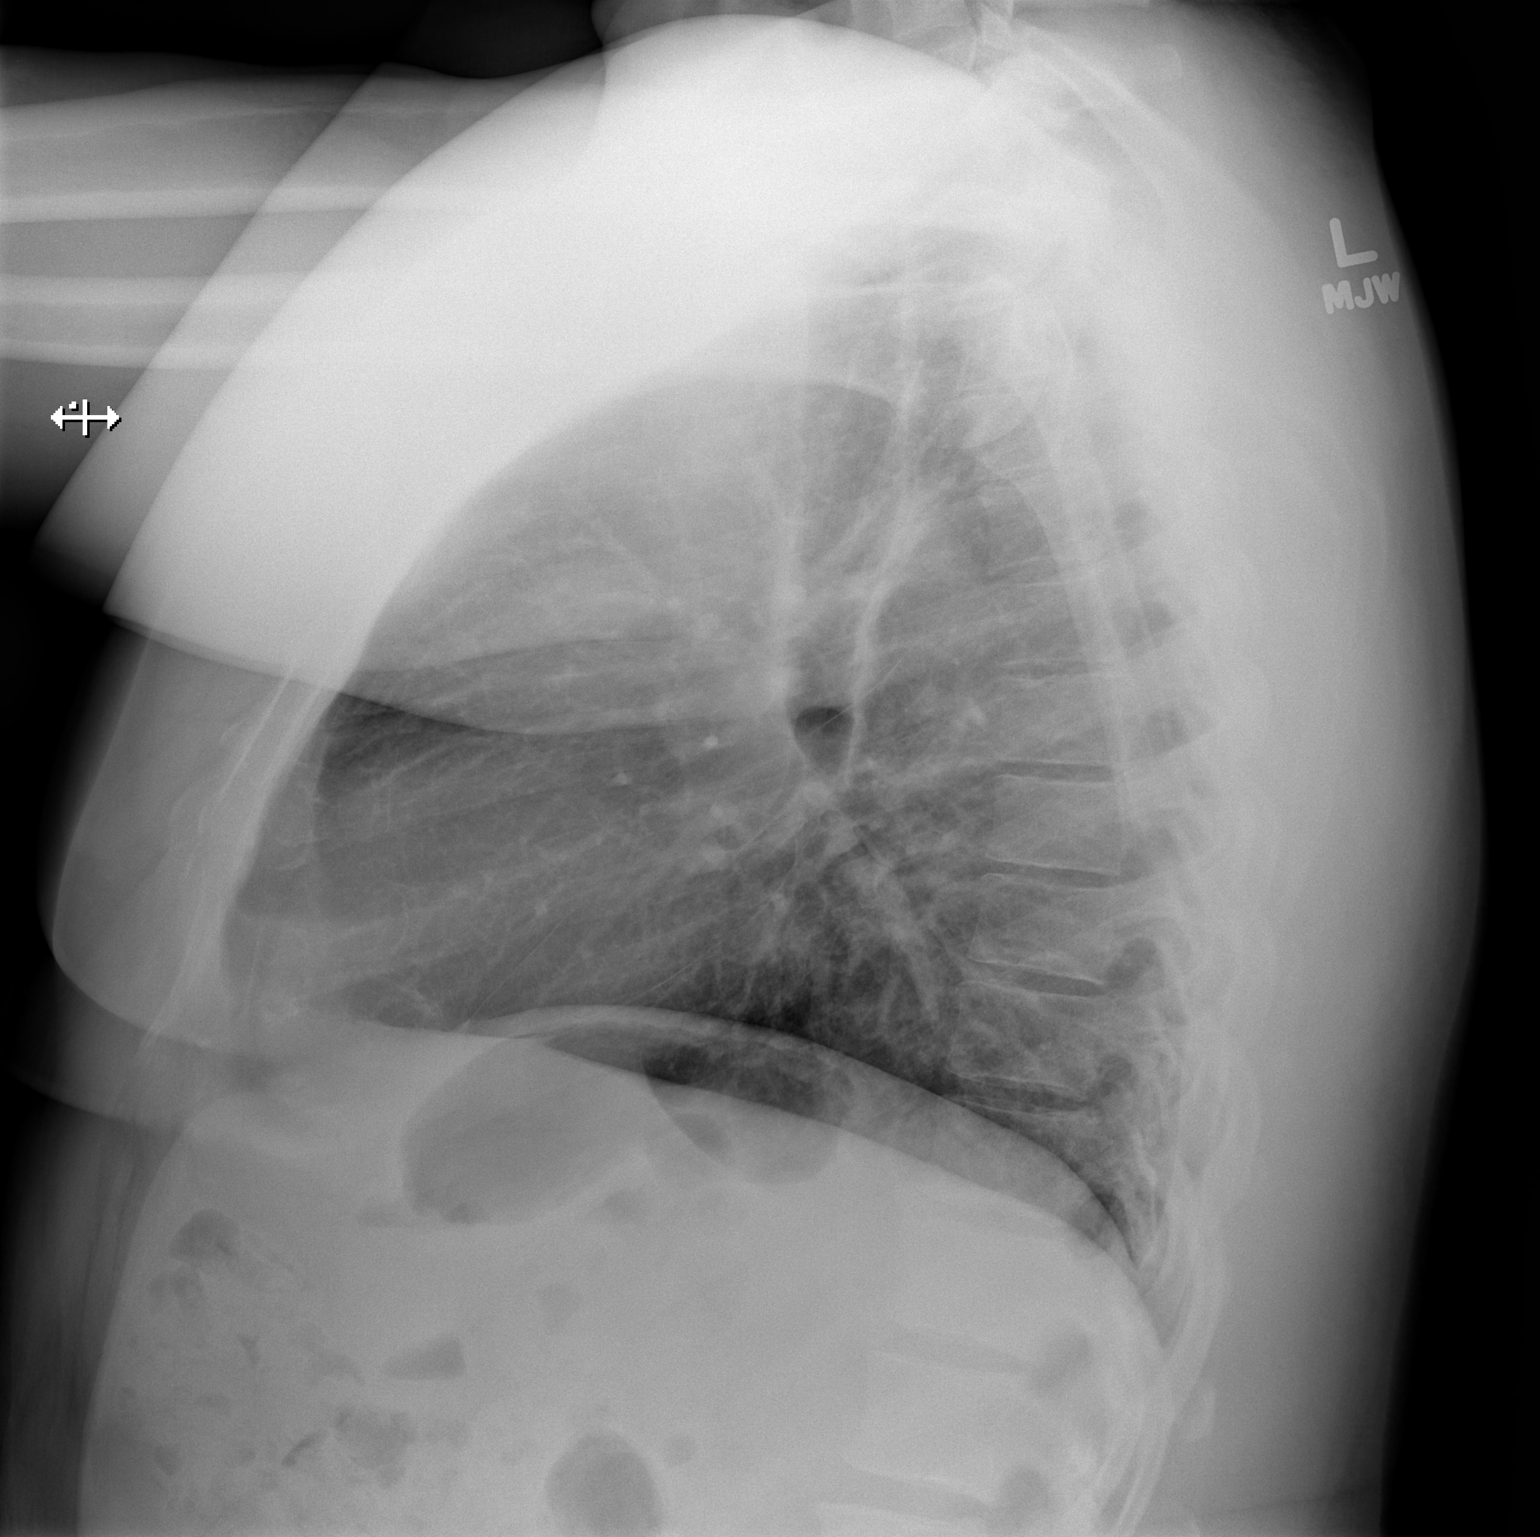

[2 of 2 positions shown; findings below may reference images not displayed]

FINDINGS: Cardiomediastinal silhouette is within normal limits.
The lungs are free of focal consolidations and pleural effusions.
No edema. Visualized osseous structures have a normal appearance.
IMPRESSION: Negative exam.

## 2014-07-01 ENCOUNTER — Other Ambulatory Visit: Payer: Self-pay

## 2014-08-31 ENCOUNTER — Ambulatory Visit: Payer: Self-pay | Admitting: Ophthalmology

## 2014-08-31 ENCOUNTER — Encounter (HOSPITAL_BASED_OUTPATIENT_CLINIC_OR_DEPARTMENT_OTHER): Payer: Self-pay | Admitting: *Deleted

## 2014-08-31 NOTE — Progress Notes (Signed)
Was om htn meds, not now-no labs needed Had 2 surgeries last year with no problems

## 2014-08-31 NOTE — Progress Notes (Signed)
   08/31/14 1447  OBSTRUCTIVE SLEEP APNEA  Have you ever been diagnosed with sleep apnea through a sleep study? No  Do you snore loudly (loud enough to be heard through closed doors)?  0  Do you often feel tired, fatigued, or sleepy during the daytime? 0  Has anyone observed you stop breathing during your sleep? 0  Do you have, or are you being treated for high blood pressure? 0  BMI more than 35 kg/m2? 1  Age over 52 years old? 1  Neck circumference greater than 40 cm/16 inches? 1  Gender: 1  Obstructive Sleep Apnea Score 4  Score 4 or greater  Results sent to PCP

## 2014-08-31 NOTE — H&P (Signed)
  Date of examination:  08-24-14  Indication for surgery: to straighten the eyes and allow some binocularity  Pertinent past medical history:  Past Medical History  Diagnosis Date  . Hyperlipidemia   . Hypertension     no meds  . Wears glasses     to read    Pertinent ocular history:  Strabismus surgery in childhood, details unavailable, ? For exotropia.  Patient has noted XT for many years  Pertinent family history:  Family History  Problem Relation Age of Onset  . Heart failure Father   . Cancer Maternal Grandmother     Ovarian  . Cancer Paternal Grandmother     Colon    General:  Healthy appearing patient in no distress.    Eyes:    Acuity [place] OD 20/20  OS 20/20  External: Within normal limits     Anterior segment: Within normal limits   X ? Temporal conj scars OU  Motility:   XT = 55+ (30 + 25)+ "V", OA of IO's nd SO's OU.  X(T)'=55. LHT 10  Fundus: deferred  Refraction:  Manifest plano OU approx  Heart: Regular rate and rhythm without murmur     Lungs: Clear to auscultation     Abdomen: Soft, nontender, normal bowel sounds     Impression:Exotropia, ? 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  Small LHT  Plan: Tentative:  LR re-recess OS (assuming previously recessed); MR resect OU  Skylur Fuston O 

## 2014-09-02 ENCOUNTER — Encounter (HOSPITAL_BASED_OUTPATIENT_CLINIC_OR_DEPARTMENT_OTHER): Payer: Self-pay | Admitting: *Deleted

## 2014-09-02 ENCOUNTER — Ambulatory Visit (HOSPITAL_BASED_OUTPATIENT_CLINIC_OR_DEPARTMENT_OTHER): Payer: BC Managed Care – PPO | Admitting: Anesthesiology

## 2014-09-02 ENCOUNTER — Ambulatory Visit (HOSPITAL_BASED_OUTPATIENT_CLINIC_OR_DEPARTMENT_OTHER)
Admission: RE | Admit: 2014-09-02 | Discharge: 2014-09-02 | Disposition: A | Discharge status: Home / Self Care | Payer: BC Managed Care – PPO | Source: Ambulatory Visit | Attending: Ophthalmology | Admitting: Ophthalmology

## 2014-09-02 ENCOUNTER — Encounter (HOSPITAL_BASED_OUTPATIENT_CLINIC_OR_DEPARTMENT_OTHER)
Admission: RE | Disposition: A | Discharge status: Home / Self Care | Payer: Self-pay | Source: Ambulatory Visit | Attending: Ophthalmology

## 2014-09-02 DIAGNOSIS — Z6839 Body mass index (BMI) 39.0-39.9, adult: Secondary | ICD-10-CM | POA: Insufficient documentation

## 2014-09-02 DIAGNOSIS — E669 Obesity, unspecified: Secondary | ICD-10-CM | POA: Insufficient documentation

## 2014-09-02 DIAGNOSIS — I10 Essential (primary) hypertension: Secondary | ICD-10-CM | POA: Insufficient documentation

## 2014-09-02 DIAGNOSIS — Z9889 Other specified postprocedural states: Secondary | ICD-10-CM | POA: Insufficient documentation

## 2014-09-02 DIAGNOSIS — H5010 Unspecified exotropia: Secondary | ICD-10-CM | POA: Insufficient documentation

## 2014-09-02 DIAGNOSIS — E785 Hyperlipidemia, unspecified: Secondary | ICD-10-CM | POA: Insufficient documentation

## 2014-09-02 HISTORY — PX: STRABISMUS SURGERY: SHX218

## 2014-09-02 HISTORY — DX: Presence of spectacles and contact lenses: Z97.3

## 2014-09-02 LAB — POCT HEMOGLOBIN-HEMACUE: Hemoglobin: 16.4 g/dL (ref 13.0–17.0)

## 2014-09-02 SURGERY — STRABISMUS SURGERY, BILATERAL
Anesthesia: General | Site: Eye | Laterality: Left

## 2014-09-02 MED ORDER — FENTANYL CITRATE 0.05 MG/ML IJ SOLN
INTRAMUSCULAR | Status: DC | PRN
  Administered 2014-09-02: 100 ug via INTRAVENOUS

## 2014-09-02 MED ORDER — HYDROMORPHONE HCL 1 MG/ML IJ SOLN
INTRAMUSCULAR | Status: AC
  Filled 2014-09-02: qty 1

## 2014-09-02 MED ORDER — DEXAMETHASONE SODIUM PHOSPHATE 4 MG/ML IJ SOLN
INTRAMUSCULAR | Status: DC | PRN
  Administered 2014-09-02: 10 mg via INTRAVENOUS

## 2014-09-02 MED ORDER — KETOROLAC TROMETHAMINE 30 MG/ML IJ SOLN
INTRAMUSCULAR | Status: DC | PRN
  Administered 2014-09-02: 30 mg via INTRAVENOUS

## 2014-09-02 MED ORDER — MIDAZOLAM HCL 2 MG/2ML IJ SOLN: 1.0000 mg | INTRAMUSCULAR | Status: DC | PRN

## 2014-09-02 MED ORDER — BSS IO SOLN
INTRAOCULAR | Status: DC | PRN
  Administered 2014-09-02: 10 mL

## 2014-09-02 MED ORDER — ONDANSETRON HCL 4 MG/2ML IJ SOLN
INTRAMUSCULAR | Status: DC | PRN
  Administered 2014-09-02: 4 mg via INTRAVENOUS

## 2014-09-02 MED ORDER — TOBRAMYCIN-DEXAMETHASONE 0.3-0.1 % OP OINT: 1.0000 "application " | TOPICAL_OINTMENT | Freq: Two times a day (BID) | OPHTHALMIC | Status: DC

## 2014-09-02 MED ORDER — OXYCODONE-ACETAMINOPHEN 7.5-325 MG PO TABS: 1.0000 | ORAL_TABLET | ORAL | Status: DC | PRN

## 2014-09-02 MED ORDER — PROPOFOL 10 MG/ML IV BOLUS
INTRAVENOUS | Status: DC | PRN
  Administered 2014-09-02: 200 mg via INTRAVENOUS

## 2014-09-02 MED ORDER — TOBRAMYCIN-DEXAMETHASONE 0.3-0.1 % OP OINT
TOPICAL_OINTMENT | OPHTHALMIC | Status: DC | PRN
  Administered 2014-09-02: 1 via OPHTHALMIC

## 2014-09-02 MED ORDER — MIDAZOLAM HCL 5 MG/5ML IJ SOLN
INTRAMUSCULAR | Status: DC | PRN
  Administered 2014-09-02: 2 mg via INTRAVENOUS

## 2014-09-02 MED ORDER — LACTATED RINGERS IV SOLN
INTRAVENOUS | Status: DC
  Administered 2014-09-02: 10:00:00 via INTRAVENOUS

## 2014-09-02 MED ORDER — PROMETHAZINE HCL 25 MG/ML IJ SOLN: 6.2500 mg | INTRAMUSCULAR | Status: DC | PRN

## 2014-09-02 MED ORDER — FENTANYL CITRATE 0.05 MG/ML IJ SOLN: 50.0000 ug | INTRAMUSCULAR | Status: DC | PRN

## 2014-09-02 MED ORDER — HYDROMORPHONE HCL 1 MG/ML IJ SOLN
0.2500 mg | INTRAMUSCULAR | Status: DC | PRN
  Administered 2014-09-02: 0.5 mg via INTRAVENOUS

## 2014-09-02 MED ORDER — ATROPINE SULFATE 0.4 MG/ML IJ SOLN
INTRAMUSCULAR | Status: DC | PRN
  Administered 2014-09-02 (×2): 0.2 mg via INTRAVENOUS

## 2014-09-02 MED ORDER — MIDAZOLAM HCL 2 MG/2ML IJ SOLN
INTRAMUSCULAR | Status: AC
  Filled 2014-09-02: qty 2

## 2014-09-02 MED ORDER — FENTANYL CITRATE 0.05 MG/ML IJ SOLN
INTRAMUSCULAR | Status: AC
  Filled 2014-09-02: qty 4

## 2014-09-02 MED ORDER — LIDOCAINE HCL (CARDIAC) 20 MG/ML IV SOLN
INTRAVENOUS | Status: DC | PRN
  Administered 2014-09-02: 50 mg via INTRAVENOUS

## 2014-09-02 SURGICAL SUPPLY — 32 items
APL SRG 3 HI ABS STRL LF PLS (MISCELLANEOUS) ×1
APPLICATOR COTTON TIP 6IN STRL (MISCELLANEOUS) ×12 IMPLANT
APPLICATOR DR MATTHEWS STRL (MISCELLANEOUS) ×3 IMPLANT
BANDAGE EYE OVAL (MISCELLANEOUS) IMPLANT
CAUTERY EYE LOW TEMP 1300F FIN (OPHTHALMIC RELATED) IMPLANT
CLOSURE WOUND 1/4X4 (GAUZE/BANDAGES/DRESSINGS)
COVER BACK TABLE 60X90IN (DRAPES) ×3 IMPLANT
COVER MAYO STAND STRL (DRAPES) ×3 IMPLANT
DRAPE SURG 17X23 STRL (DRAPES) ×6 IMPLANT
DRAPE U-SHAPE 76X120 STRL (DRAPES) ×2 IMPLANT
GLOVE BIO SURGEON STRL SZ 6.5 (GLOVE) ×2 IMPLANT
GLOVE BIO SURGEONS STRL SZ 6.5 (GLOVE) ×1
GLOVE BIOGEL M STRL SZ7.5 (GLOVE) ×6 IMPLANT
GOWN STRL REUS W/ TWL LRG LVL3 (GOWN DISPOSABLE) ×1 IMPLANT
GOWN STRL REUS W/TWL LRG LVL3 (GOWN DISPOSABLE) ×3
GOWN STRL REUS W/TWL XL LVL3 (GOWN DISPOSABLE) ×3 IMPLANT
NS IRRIG 1000ML POUR BTL (IV SOLUTION) ×3 IMPLANT
PACK BASIN DAY SURGERY FS (CUSTOM PROCEDURE TRAY) ×3 IMPLANT
SHEET MEDIUM DRAPE 40X70 STRL (DRAPES) IMPLANT
SLEEVE SCD COMPRESS KNEE MED (MISCELLANEOUS) ×3 IMPLANT
SPEAR EYE SURG WECK-CEL (MISCELLANEOUS) ×6 IMPLANT
STRIP CLOSURE SKIN 1/4X4 (GAUZE/BANDAGES/DRESSINGS) IMPLANT
SUT 6 0 SILK T G140 8DA (SUTURE) IMPLANT
SUT MERSILENE 6 0 S14 DA (SUTURE) IMPLANT
SUT PLAIN 6 0 TG1408 (SUTURE) ×2 IMPLANT
SUT SILK 4 0 C 3 735G (SUTURE) IMPLANT
SUT VICRYL 6 0 S 28 (SUTURE) ×4 IMPLANT
SUT VICRYL ABS 6-0 S29 18IN (SUTURE) ×2 IMPLANT
SYR TB 1ML LL NO SAFETY (SYRINGE) ×3 IMPLANT
SYRINGE 10CC LL (SYRINGE) ×3 IMPLANT
TOWEL OR 17X24 6PK STRL BLUE (TOWEL DISPOSABLE) ×3 IMPLANT
TRAY DSU PREP LF (CUSTOM PROCEDURE TRAY) ×3 IMPLANT

## 2014-09-02 NOTE — Anesthesia Procedure Notes (Signed)
Procedure Name: LMA Insertion Performed by: Terrance Mass Pre-anesthesia Checklist: Timeout performed, Patient identified, Emergency Drugs available, Suction available and Patient being monitored Patient Re-evaluated:Patient Re-evaluated prior to inductionOxygen Delivery Method: Circle system utilized Preoxygenation: Pre-oxygenation with 100% oxygen Intubation Type: IV induction Ventilation: Mask ventilation without difficulty LMA: LMA flexible inserted LMA Size: 4.0 Number of attempts: 1 Placement Confirmation: positive ETCO2 and breath sounds checked- equal and bilateral Tube secured with: Tape Dental Injury: Teeth and Oropharynx as per pre-operative assessment

## 2014-09-02 NOTE — Anesthesia Postprocedure Evaluation (Signed)
Anesthesia Post Note  Patient: Nathan Butler.  Procedure(s) Performed: Procedure(s) (LRB): REPAIR STRABISMUS BILATERAL (Left)  Anesthesia type: general  Patient location: PACU  Post pain: Pain level controlled  Post assessment: Patient's Cardiovascular Status Stable  Last Vitals:  Filed Vitals:   09/02/14 1320  BP: 159/94  Pulse: 62  Temp: 36.7 C  Resp: 22    Post vital signs: Reviewed and stable  Level of consciousness: sedated  Complications: No apparent anesthesia complications

## 2014-09-02 NOTE — Discharge Instructions (Signed)
Diet: Clear liquids, advance to soft foods then regular diet as tolerated.  Pain control:   1)  Ibuprofen 600 mg by mouth every 6-8 hours as needed for pain  2)  Percocet 7.5/325 one or two by mouth as needed every 4-6 hours as needed  for pain that is not resolved by ibuprofen  Eye medications: Tobradex eye ointment 1/2 inch in left eye twice a day for one week  Activity: No swimming for 1 week.  It is OK to let water run over the face and eyes while showering or taking a bath, even during the first week.  No other restriction on exercise or activity.  Call Dr. Janee Morn office [phone] with any problems or concerns.    Post Anesthesia Home Care Instructions  Activity: Get plenty of rest for the remainder of the day. A responsible adult should stay with you for 24 hours following the procedure.  For the next 24 hours, DO NOT: -Drive a car -Paediatric nurse -Drink alcoholic beverages -Take any medication unless instructed by your physician -Make any legal decisions or sign important papers.  Meals: Start with liquid foods such as gelatin or soup. Progress to regular foods as tolerated. Avoid greasy, spicy, heavy foods. If nausea and/or vomiting occur, drink only clear liquids until the nausea and/or vomiting subsides. Call your physician if vomiting continues.  Special Instructions/Symptoms: Your throat may feel dry or sore from the anesthesia or the breathing tube placed in your throat during surgery. If this causes discomfort, gargle with warm salt water. The discomfort should disappear within 24 hours.

## 2014-09-02 NOTE — H&P (View-Only) (Signed)
  Date of examination:  08-24-14  Indication for surgery: to straighten the eyes and allow some binocularity  Pertinent past medical history:  Past Medical History  Diagnosis Date  . Hyperlipidemia   . Hypertension     no meds  . Wears glasses     to read    Pertinent ocular history:  Strabismus surgery in childhood, details unavailable, ? For exotropia.  Patient has noted XT for many years  Pertinent family history:  Family History  Problem Relation Age of Onset  . Heart failure Father   . Cancer Maternal Grandmother     Ovarian  . Cancer Paternal Grandmother     Colon    General:  Healthy appearing patient in no distress.    Eyes:    Acuity [place] OD 20/20  OS 20/20  External: Within normal limits     Anterior segment: Within normal limits   X ? Temporal conj scars OU  Motility:   XT = 55+ (30 + 25)+ "V", OA of IO's nd SO's OU.  X(T)'=55. LHT 10  Fundus: deferred  Refraction:  Manifest plano OU approx  Heart: Regular rate and rhythm without murmur     Lungs: Clear to auscultation     Abdomen: Soft, nontender, normal bowel sounds     Impression:Exotropia, ? 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  Small LHT  Plan: Tentative:  LR re-recess OS (assuming previously recessed); MR resect OU  Menaal Russum O

## 2014-09-02 NOTE — Transfer of Care (Signed)
Immediate Anesthesia Transfer of Care Note  Patient: Nathan Butler.  Procedure(s) Performed: Procedure(s): REPAIR STRABISMUS BILATERAL (Left)  Patient Location: PACU  Anesthesia Type:General  Level of Consciousness: awake, alert  and oriented  Airway & Oxygen Therapy: Patient Spontanous Breathing  Post-op Assessment: Report given to PACU RN and Post -op Vital signs reviewed and stable  Post vital signs: Reviewed and stable  Complications: No apparent anesthesia complications

## 2014-09-02 NOTE — Op Note (Signed)
   12:14 PM  PATIENT:  Nathan Butler.  53 y.o. 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  Medial rectus muscle resection 8.0 mm left eye  SURGEON:  Lorne Skeens.Annamaria Boots, M.D.   ANESTHESIA:   general  COMPLICATIONS:None  DESCRIPTION OF PROCEDURE: The patient was taken to the operating room where He was identified by me. General anesthesia was induced without difficulty after placement of appropriate monitors. The patient was prepped and draped in standard sterile fashion. A lid speculum was placed in the left eye.  Through an inferotemporal fornix incision through conjunctiva and Tenon's fascia, the left lateral rectus muscle was engaged on a series of muscle hooks and cleared of its fascial attachments. The tendon was secured with a double-armed 6-0 Vicryl suture with a double locking bite at each border of the muscle, 1 mm from the insertion. The muscle was disinserted, and was reattached to sclera at a measured distance of 11.0 millimeters posterior to the original insertion, using direct scleral passes in crossed swords fashion.  The suture ends were tied securely after the position of the muscle had been checked and found to be accurate. Conjunctiva was closed with 2 6-0 plain gut sutures.  Through an inferonasal fornix incision through conjunctiva and Tenon fascia, the left medial rectus muscle was engaged on a series of muscle hooks and carefully cleared of its fascial attachments. The muscle was spread between 2 self-retaining hooks. A 2 mm bite was taken of the center of the muscle belly at a measured distance of 8.0 mm posterior to the insertion. A knot was tied securely at this location. The needle at each end of the double-armed suture was passed from the center of the muscle belly to the periphery,  parallel to and 8.0 mm posterior to the insertion. A double locking bite was placed at each border of the muscle. A resection clamp was placed on the muscle just anterior to the sutures. The muscle was disinserted. Each pole suture was passed posteriorly to anteriorly through the corresponding end of the muscle stump, then anteriorly to posteriorly near the center of the stump, then posteriorly to anteriorly through the center of the muscle belly, just posterior to the previously placed knot. The muscle was drawn up to the level of the original insertion, and all slack was removed before the suture ends were tied securely. The clamp was removed. The portion of the muscle anterior to the sutures was carefully excised. Conjunctiva was closed with 2 6-0 plain gut sutures. TobraDex ointment was placed in the left eye. The patient was awakened without difficulty and taken to the recovery room in stable condition, having suffered no intraoperative or immediate postoperative complications.  Lorne Skeens. Amadi Yoshino M.D.    PATIENT DISPOSITION:  PACU - hemodynamically stable.

## 2014-09-02 NOTE — Anesthesia Preprocedure Evaluation (Signed)
Anesthesia Evaluation  Patient identified by MRN, date of birth, ID band Patient awake    Reviewed: Allergy & Precautions, H&P , NPO status , Patient's Chart, lab work & pertinent test results  History of Anesthesia Complications Negative for: history of anesthetic complications  Airway Mallampati: I       Dental  (+) Teeth Intact   Pulmonary neg pulmonary ROS,  breath sounds clear to auscultation        Cardiovascular hypertension, Rhythm:Regular Rate:Normal     Neuro/Psych negative neurological ROS     GI/Hepatic negative GI ROS, Neg liver ROS,   Endo/Other  negative endocrine ROS  Renal/GU negative Renal ROS     Musculoskeletal negative musculoskeletal ROS (+)   Abdominal (+) + obese,   Peds  Hematology negative hematology ROS (+)   Anesthesia Other Findings   Reproductive/Obstetrics                             Anesthesia Physical Anesthesia Plan  ASA: II  Anesthesia Plan: General   Post-op Pain Management:    Induction: Intravenous  Airway Management Planned: Oral ETT and LMA  Additional Equipment:   Intra-op Plan:   Post-operative Plan:   Informed Consent:   Dental advisory given  Plan Discussed with: CRNA and Surgeon  Anesthesia Plan Comments:         Anesthesia Quick Evaluation

## 2014-09-02 NOTE — Interval H&P Note (Signed)
History and Physical Interval Note:  09/02/2014 10:52 AM  Nathan Butler.  has presented today for surgery, with the diagnosis of EXOTROPIA  The various methods of treatment have been discussed with the patient and family. After consideration of risks, benefits and other options for treatment, the patient has consented to  Procedure(s): REPAIR STRABISMUS BILATERAL (Bilateral) as a surgical intervention .  The patient's history has been reviewed, patient examined, no change in status, stable for surgery.  I have reviewed the patient's chart and labs.  Questions were answered to the patient's satisfaction.     Derry Skill

## 2014-09-05 ENCOUNTER — Encounter (HOSPITAL_BASED_OUTPATIENT_CLINIC_OR_DEPARTMENT_OTHER): Payer: Self-pay | Admitting: Ophthalmology

## 2014-09-12 NOTE — Anesthesia Postprocedure Evaluation (Signed)
  Anesthesia Post-op Note  Patient: Nathan Butler.  Procedure(s) Performed: Procedure(s): REPAIR STRABISMUS BILATERAL (Left)  Patient Location: PACU  Anesthesia Type:General  Level of Consciousness: awake and alert   Airway and Oxygen Therapy: Patient Spontanous Breathing  Post-op Pain: mild  Post-op Assessment: Post-op Vital signs reviewed  Post-op Vital Signs: stable  Last Vitals:  Filed Vitals:   09/02/14 1320  BP: 159/94  Pulse: 62  Temp: 36.7 C  Resp: 22    Complications: No apparent anesthesia complications

## 2015-02-22 ENCOUNTER — Encounter (HOSPITAL_BASED_OUTPATIENT_CLINIC_OR_DEPARTMENT_OTHER): Payer: Self-pay | Admitting: *Deleted

## 2015-02-24 ENCOUNTER — Ambulatory Visit (HOSPITAL_BASED_OUTPATIENT_CLINIC_OR_DEPARTMENT_OTHER)
Admission: RE | Admit: 2015-02-24 | Discharge: 2015-02-24 | Disposition: A | Discharge status: Home / Self Care | Payer: BLUE CROSS/BLUE SHIELD | Source: Ambulatory Visit | Attending: Ophthalmology | Admitting: Ophthalmology

## 2015-02-24 ENCOUNTER — Encounter (HOSPITAL_BASED_OUTPATIENT_CLINIC_OR_DEPARTMENT_OTHER)
Admission: RE | Disposition: A | Discharge status: Home / Self Care | Payer: Self-pay | Source: Ambulatory Visit | Attending: Ophthalmology

## 2015-02-24 ENCOUNTER — Ambulatory Visit: Payer: Self-pay | Admitting: Ophthalmology

## 2015-02-24 ENCOUNTER — Encounter (HOSPITAL_BASED_OUTPATIENT_CLINIC_OR_DEPARTMENT_OTHER): Payer: Self-pay | Admitting: Certified Registered"

## 2015-02-24 ENCOUNTER — Ambulatory Visit (HOSPITAL_BASED_OUTPATIENT_CLINIC_OR_DEPARTMENT_OTHER): Payer: BLUE CROSS/BLUE SHIELD | Admitting: Certified Registered"

## 2015-02-24 DIAGNOSIS — H5010 Unspecified exotropia: Secondary | ICD-10-CM | POA: Diagnosis not present

## 2015-02-24 DIAGNOSIS — I10 Essential (primary) hypertension: Secondary | ICD-10-CM | POA: Insufficient documentation

## 2015-02-24 HISTORY — PX: STRABISMUS SURGERY: SHX218

## 2015-02-24 LAB — POCT HEMOGLOBIN-HEMACUE: Hemoglobin: 17.6 g/dL — ABNORMAL HIGH (ref 13.0–17.0)

## 2015-02-24 SURGERY — REPAIR STRABISMUS
Anesthesia: General | Site: Eye | Laterality: Right

## 2015-02-24 MED ORDER — BSS IO SOLN
INTRAOCULAR | Status: AC
  Filled 2015-02-24: qty 15

## 2015-02-24 MED ORDER — MIDAZOLAM HCL 2 MG/2ML IJ SOLN
1.0000 mg | INTRAMUSCULAR | Status: DC | PRN
  Administered 2015-02-24: 2 mg via INTRAVENOUS

## 2015-02-24 MED ORDER — GLYCOPYRROLATE 0.2 MG/ML IJ SOLN
0.2000 mg | Freq: Once | INTRAMUSCULAR | Status: AC | PRN
  Administered 2015-02-24: 0.2 mg via INTRAVENOUS

## 2015-02-24 MED ORDER — HYDROMORPHONE HCL 1 MG/ML IJ SOLN
INTRAMUSCULAR | Status: AC
  Filled 2015-02-24: qty 1

## 2015-02-24 MED ORDER — MIDAZOLAM HCL 2 MG/2ML IJ SOLN
INTRAMUSCULAR | Status: AC
  Filled 2015-02-24: qty 2

## 2015-02-24 MED ORDER — TOBRAMYCIN-DEXAMETHASONE 0.3-0.1 % OP OINT
TOPICAL_OINTMENT | OPHTHALMIC | Status: AC
  Filled 2015-02-24: qty 3.5

## 2015-02-24 MED ORDER — KETOROLAC TROMETHAMINE 30 MG/ML IJ SOLN
INTRAMUSCULAR | Status: DC | PRN
  Administered 2015-02-24: 30 mg via INTRAVENOUS

## 2015-02-24 MED ORDER — BSS IO SOLN
INTRAOCULAR | Status: DC | PRN
  Administered 2015-02-24: 10 mL via INTRAOCULAR

## 2015-02-24 MED ORDER — HYDROMORPHONE HCL 1 MG/ML IJ SOLN
0.2500 mg | INTRAMUSCULAR | Status: DC | PRN
  Administered 2015-02-24: 0.5 mg via INTRAVENOUS

## 2015-02-24 MED ORDER — LACTATED RINGERS IV SOLN
INTRAVENOUS | Status: DC
  Administered 2015-02-24: 10 mL/h via INTRAVENOUS
  Administered 2015-02-24: 12:00:00 via INTRAVENOUS

## 2015-02-24 MED ORDER — LIDOCAINE HCL (CARDIAC) 20 MG/ML IV SOLN
INTRAVENOUS | Status: DC | PRN
  Administered 2015-02-24: 100 mg via INTRAVENOUS

## 2015-02-24 MED ORDER — OXYCODONE HCL 5 MG PO TABS: 5.0000 mg | ORAL_TABLET | Freq: Once | ORAL | Status: DC | PRN

## 2015-02-24 MED ORDER — PROPOFOL 10 MG/ML IV BOLUS
INTRAVENOUS | Status: DC | PRN
  Administered 2015-02-24: 400 mg via INTRAVENOUS

## 2015-02-24 MED ORDER — OXYCODONE-ACETAMINOPHEN 7.5-325 MG PO TABS: 1.0000 | ORAL_TABLET | ORAL | Status: DC | PRN

## 2015-02-24 MED ORDER — ONDANSETRON HCL 4 MG/2ML IJ SOLN
INTRAMUSCULAR | Status: DC | PRN
  Administered 2015-02-24: 4 mg via INTRAVENOUS

## 2015-02-24 MED ORDER — FENTANYL CITRATE (PF) 100 MCG/2ML IJ SOLN
50.0000 ug | INTRAMUSCULAR | Status: DC | PRN
  Administered 2015-02-24: 100 ug via INTRAVENOUS
  Administered 2015-02-24: 50 ug via INTRAVENOUS

## 2015-02-24 MED ORDER — FENTANYL CITRATE (PF) 100 MCG/2ML IJ SOLN
INTRAMUSCULAR | Status: AC
  Filled 2015-02-24: qty 6

## 2015-02-24 MED ORDER — OXYCODONE HCL 5 MG/5ML PO SOLN: 5.0000 mg | Freq: Once | ORAL | Status: DC | PRN

## 2015-02-24 MED ORDER — TOBRAMYCIN-DEXAMETHASONE 0.3-0.1 % OP OINT: 1.0000 "application " | TOPICAL_OINTMENT | Freq: Two times a day (BID) | OPHTHALMIC | Status: DC

## 2015-02-24 MED ORDER — EPHEDRINE SULFATE 50 MG/ML IJ SOLN
INTRAMUSCULAR | Status: DC | PRN
  Administered 2015-02-24 (×2): 10 mg via INTRAVENOUS

## 2015-02-24 MED ORDER — MEPERIDINE HCL 25 MG/ML IJ SOLN: 6.2500 mg | INTRAMUSCULAR | Status: DC | PRN

## 2015-02-24 MED ORDER — DEXAMETHASONE SODIUM PHOSPHATE 4 MG/ML IJ SOLN
INTRAMUSCULAR | Status: DC | PRN
  Administered 2015-02-24: 10 mg via INTRAVENOUS

## 2015-02-24 MED ORDER — TOBRAMYCIN-DEXAMETHASONE 0.3-0.1 % OP OINT
TOPICAL_OINTMENT | OPHTHALMIC | Status: DC | PRN
  Administered 2015-02-24: 1 via OPHTHALMIC

## 2015-02-24 SURGICAL SUPPLY — 32 items
APL SRG 3 HI ABS STRL LF PLS (MISCELLANEOUS) ×1
APPLICATOR COTTON TIP 6IN STRL (MISCELLANEOUS) ×12 IMPLANT
APPLICATOR DR MATTHEWS STRL (MISCELLANEOUS) ×3 IMPLANT
BANDAGE EYE OVAL (MISCELLANEOUS) ×4 IMPLANT
CAUTERY EYE LOW TEMP 1300F FIN (OPHTHALMIC RELATED) IMPLANT
CLOSURE WOUND 1/4X4 (GAUZE/BANDAGES/DRESSINGS) ×1
COVER BACK TABLE 60X90IN (DRAPES) ×3 IMPLANT
COVER MAYO STAND STRL (DRAPES) ×3 IMPLANT
DRAPE SURG 17X23 STRL (DRAPES) ×8 IMPLANT
DRAPE U-SHAPE 76X120 STRL (DRAPES) IMPLANT
GLOVE BIO SURGEON STRL SZ 6.5 (GLOVE) ×2 IMPLANT
GLOVE BIO SURGEONS STRL SZ 6.5 (GLOVE) ×1
GLOVE BIOGEL M STRL SZ7.5 (GLOVE) ×6 IMPLANT
GOWN STRL REUS W/ TWL LRG LVL3 (GOWN DISPOSABLE) ×1 IMPLANT
GOWN STRL REUS W/TWL LRG LVL3 (GOWN DISPOSABLE) ×3
GOWN STRL REUS W/TWL XL LVL3 (GOWN DISPOSABLE) ×3 IMPLANT
NS IRRIG 1000ML POUR BTL (IV SOLUTION) ×3 IMPLANT
PACK BASIN DAY SURGERY FS (CUSTOM PROCEDURE TRAY) ×3 IMPLANT
SHEET MEDIUM DRAPE 40X70 STRL (DRAPES) IMPLANT
SLEEVE SCD COMPRESS KNEE MED (MISCELLANEOUS) ×3 IMPLANT
SPEAR EYE SURG WECK-CEL (MISCELLANEOUS) ×6 IMPLANT
STRIP CLOSURE SKIN 1/4X4 (GAUZE/BANDAGES/DRESSINGS) ×1 IMPLANT
SUT 6 0 SILK T G140 8DA (SUTURE) ×2 IMPLANT
SUT MERSILENE 6 0 S14 DA (SUTURE) IMPLANT
SUT PLAIN 6 0 TG1408 (SUTURE) ×2 IMPLANT
SUT SILK 4 0 C 3 735G (SUTURE) IMPLANT
SUT VICRYL 6 0 S 28 (SUTURE) IMPLANT
SUT VICRYL ABS 6-0 S29 18IN (SUTURE) ×2 IMPLANT
SYR TB 1ML LL NO SAFETY (SYRINGE) ×3 IMPLANT
SYRINGE 10CC LL (SYRINGE) ×3 IMPLANT
TOWEL OR 17X24 6PK STRL BLUE (TOWEL DISPOSABLE) ×3 IMPLANT
TRAY DSU PREP LF (CUSTOM PROCEDURE TRAY) ×3 IMPLANT

## 2015-02-24 NOTE — Transfer of Care (Signed)
Immediate Anesthesia Transfer of Care Note  Patient: Nathan Butler.  Procedure(s) Performed: Procedure(s): REPAIR STRABISMUS RIGHT EYE (Right)  Patient Location: PACU  Anesthesia Type:General  Level of Consciousness: awake, alert  and oriented  Airway & Oxygen Therapy: Patient Spontanous Breathing and Patient connected to face mask oxygen  Post-op Assessment: Report given to RN, Post -op Vital signs reviewed and stable and Patient moving all extremities  Post vital signs: Reviewed and stable  Last Vitals:  Filed Vitals:   02/24/15 1013  BP: 170/85  Pulse: 62  Temp: 36.9 C  Resp: 20    Complications: No apparent anesthesia complications

## 2015-02-24 NOTE — Interval H&P Note (Signed)
History and Physical Interval Note:  02/24/2015 11:06 AM  Nathan Butler.  has presented today for surgery, with the diagnosis of exotropia  The various methods of treatment have been discussed with the patient and family. After consideration of risks, benefits and other options for treatment, the patient has consented to  Procedure(s): REPAIR STRABISMUS RIGHT EYE (Right) as a surgical intervention .  The patient's history has been reviewed, patient examined, no change in status, stable for surgery.  I have reviewed the patient's chart and labs.  Questions were answered to the patient's satisfaction.     Derry Skill

## 2015-02-24 NOTE — Anesthesia Preprocedure Evaluation (Signed)
Anesthesia Evaluation  Patient identified by MRN, date of birth, ID band Patient awake    Reviewed: Allergy & Precautions, NPO status , Patient's Chart, lab work & pertinent test results  Airway Mallampati: I  TM Distance: >3 FB Neck ROM: Full    Dental  (+) Teeth Intact, Dental Advisory Given   Pulmonary  breath sounds clear to auscultation        Cardiovascular hypertension, Rhythm:Regular Rate:Normal     Neuro/Psych    GI/Hepatic   Endo/Other    Renal/GU      Musculoskeletal   Abdominal   Peds  Hematology   Anesthesia Other Findings   Reproductive/Obstetrics                             Anesthesia Physical Anesthesia Plan  ASA: II  Anesthesia Plan: General   Post-op Pain Management:    Induction: Intravenous  Airway Management Planned: LMA  Additional Equipment:   Intra-op Plan:   Post-operative Plan: Extubation in OR  Informed Consent: I have reviewed the patients History and Physical, chart, labs and discussed the procedure including the risks, benefits and alternatives for the proposed anesthesia with the patient or authorized representative who has indicated his/her understanding and acceptance.   Dental advisory given  Plan Discussed with: CRNA, Anesthesiologist and Surgeon  Anesthesia Plan Comments:         Anesthesia Quick Evaluation

## 2015-02-24 NOTE — H&P (Signed)
  Date of examination:  02-03-15  Indication for surgery: to straighten the eys and allow some binocularity  Pertinent past medical history:  Past Medical History  Diagnosis Date  . Hyperlipidemia   . Hypertension     no meds  . Wears glasses     to read    Pertinent ocular history:  Large R&R OS by me 12/15; hx previous surgery on OD, details unknown  Pertinent family history:  Family History  Problem Relation Age of Onset  . Heart failure Father   . Cancer Maternal Grandmother     Ovarian  . Cancer Paternal Grandmother     Colon    General:  Healthy appearing patient in no distress.    Eyes:    Acuity [place]  OD 20/20  OS 20/20  External: Within normal limits     Anterior segment: Within normal limits   X healed conj scars OU  Motility:   XT = 28 +"V". XT'=30  Fundus: deferred  Refraction: plano OU approx  Heart: Regular rate and rhythm without murmur     Lungs: Clear to auscultation     Abdomen: Soft, nontender, normal bowel sounds     Impression:Exotropia, residual,s/p large R&R OS, hx previous strabismus surgery OD details unknown  Plan: Explore RMR and RLR.  Possible RLR recess adjustable, RMR advance/resect  Derry Skill

## 2015-02-24 NOTE — H&P (View-Only) (Signed)
  Date of examination:  02-03-15  Indication for surgery: to straighten the eys and allow some binocularity  Pertinent past medical history:  Past Medical History  Diagnosis Date  . Hyperlipidemia   . Hypertension     no meds  . Wears glasses     to read    Pertinent ocular history:  Large R&R OS by me 12/15; hx previous surgery on OD, details unknown  Pertinent family history:  Family History  Problem Relation Age of Onset  . Heart failure Father   . Cancer Maternal Grandmother     Ovarian  . Cancer Paternal Grandmother     Colon    General:  Healthy appearing patient in no distress.    Eyes:    Acuity [place]  OD 20/20  OS 20/20  External: Within normal limits     Anterior segment: Within normal limits   X healed conj scars OU  Motility:   XT = 28 +"V". XT'=30  Fundus: deferred  Refraction: plano OU approx  Heart: Regular rate and rhythm without murmur     Lungs: Clear to auscultation     Abdomen: Soft, nontender, normal bowel sounds     Impression:Exotropia, residual,s/p large R&R OS, hx previous strabismus surgery OD details unknown  Plan: Explore RMR and RLR.  Possible RLR recess adjustable, RMR advance/resect  Derry Skill

## 2015-02-24 NOTE — Anesthesia Procedure Notes (Signed)
Procedure Name: LMA Insertion Date/Time: 02/24/2015 11:29 AM Performed by: Baxter Flattery Pre-anesthesia Checklist: Patient identified, Emergency Drugs available, Suction available and Patient being monitored Patient Re-evaluated:Patient Re-evaluated prior to inductionOxygen Delivery Method: Circle System Utilized Preoxygenation: Pre-oxygenation with 100% oxygen Intubation Type: IV induction Ventilation: Mask ventilation without difficulty LMA: LMA flexible inserted LMA Size: 5.0 Number of attempts: 1 Airway Equipment and Method: Bite block Placement Confirmation: positive ETCO2 and breath sounds checked- equal and bilateral Tube secured with: Tape Dental Injury: Teeth and Oropharynx as per pre-operative assessment

## 2015-02-24 NOTE — Anesthesia Postprocedure Evaluation (Signed)
  Anesthesia Post-op Note  Patient: Nathan Butler.  Procedure(s) Performed: Procedure(s): REPAIR STRABISMUS RIGHT EYE (Right)  Patient Location: PACU  Anesthesia Type: General   Level of Consciousness: awake, alert  and oriented  Airway and Oxygen Therapy: Patient Spontanous Breathing  Post-op Pain: mild  Post-op Assessment: Post-op Vital signs reviewed  Post-op Vital Signs: Reviewed  Last Vitals:  Filed Vitals:   02/24/15 1411  BP: 155/89  Pulse: 63  Temp: 37.1 C  Resp: 18    Complications: No apparent anesthesia complications

## 2015-02-24 NOTE — Discharge Instructions (Signed)
Diet: Clear liquids, advance to soft foods then regular diet as tolerated.  Pain control:   1)  Ibuprofen 600 mg by mouth every 6-8 hours as needed for pain  2)  Percocet 7.5/325 one or two by mouth as needed every 4-6 hours as needed  for pain that is not resolved by ibuprofen  Eye medications:  None  Keep patch on right eye till seen [place] office at 3:30 pm today  Activity: No swimming for 1 week.  It is OK to let water run over the face and eyes while showering or taking a bath, even during the first week.  No other restriction on exercise or activity.  Call Dr. Janee Morn office [phone] with any problems or concerns.   Post Anesthesia Home Care Instructions  Activity: Get plenty of rest for the remainder of the day. A responsible adult should stay with you for 24 hours following the procedure.  For the next 24 hours, DO NOT: -Drive a car -Paediatric nurse -Drink alcoholic beverages -Take any medication unless instructed by your physician -Make any legal decisions or sign important papers.  Meals: Start with liquid foods such as gelatin or soup. Progress to regular foods as tolerated. Avoid greasy, spicy, heavy foods. If nausea and/or vomiting occur, drink only clear liquids until the nausea and/or vomiting subsides. Call your physician if vomiting continues.  Special Instructions/Symptoms: Your throat may feel dry or sore from the anesthesia or the breathing tube placed in your throat during surgery. If this causes discomfort, gargle with warm salt water. The discomfort should disappear within 24 hours.  If you had a scopolamine patch placed behind your ear for the management of post- operative nausea and/or vomiting:  1. The medication in the patch is effective for 72 hours, after which it should be removed.  Wrap patch in a tissue and discard in the trash. Wash hands thoroughly with soap and water. 2. You may remove the patch earlier than 72 hours if you experience  unpleasant side effects which may include dry mouth, dizziness or visual disturbances. 3. Avoid touching the patch. Wash your hands with soap and water after contact with the patch.

## 2015-02-24 NOTE — Op Note (Signed)
02/24/2015  12:52 PM  PATIENT:  Nathan Butler.    PRE-OPERATIVE DIAGNOSIS:  Exotropia, residual  POST-OPERATIVE DIAGNOSIS:  same  PROCEDURE:  1. Exploration of right medial rectus muscle and right lateral rectus muscle   2. Right lateral rectus muscle recession, 8.0 mm, adjustable   3. Right medial rectus muscle resection 7.5 mm  SURGEON:  Derry Skill, MD  ANESTHESIA:   General  COMPLICATIONS: none  OPERATIVE PROCEDURE: After routine preoperative evaluation including informed consent, the patient was taken to the operative room where he was identified by me. Gen. Anesthesia was induced without difficulty after placement of appropriate monitors. The patient was prepped and draped in standard sterile fashion. A lid speculum was placed in the right eye.  Through an inferonasal fornix incision through conjunctiva and Tenon's fascia, the right medial rectus muscle was engaged on a series of muscle hooks. There is mild conjunctival scarring, consistent with previous surgery, but the muscle appeared inserted at the normal location of approximatey 5-6 mm posterior to the limbus.Through an inferotemporal fornix incision through conjunctiva and Tenon's fascia, the right lateral rectus muscle was engaged on a series of muscle hooks. Again there was fairly extensive scarring, consistent with previous surgery, but the muscle was found inserted approximately 8 mm posterior to the limbus. It was elected to recess the lateral rectus muscle on an adjustable suture and resect medial rectus muscle.  The right lateral rectus muscle was cleared of its fascial attachments and scar tissue. The tendon was secured with a 6-0 Vicryl suture, with a locking bite at each border of the muscle, 1 mm from the insertion. The muscle was disinserted. Each pole suture was passed back into sclera in crossed swords fashion at a measured distance of 8 mm posterior to the limbus (i.e. Into the original muscle stump). The  muscle was drawn up to the level of the original insertion. The pole sutures were tied together approximately 10 cm above sclera. The pole sutures were then joined together with a needle driver at a measured distance of 8.0 mm above sclera, with the muscle still drawn up to the level of the original insertion. A noose suture of 6-0 Vicryl was then tied around the 2 pole sutures. The muscle was allowed to hang back until the noose knot reached sclera, creating a 8.0 mm adjustable hang back recession of the lateral rectus muscle. Conjunctiva was apposed loosely with a 6-0 plain gut suture, leaving the incision open to facilitate suture adjustment. A 6-0 silk traction suture was placed at the temporal limbus.  The right medial rectus muscle was again engaged on a series of muscle hooks. It was spread between 2 self-retaining hooks. A 2 mm bite was taken of the center of the muscle belly at a measured distance of 7.5 mm posterior to the insertion, and a knot was tied securely at this location. The needle at each end of the double-armed suture was passed from the center of the muscle belly to the periphery, parallel to and 7.5 mm posterior to the insertion. A locking bite was placed in each border of the muscle. A resection clamp was placed placed on the muscle just anterior to the sutures. The muscle was disinserted. Each pole suture was passed posteriorly to anteriorly through the corresponding end of the muscle stump, then anteriorly to posteriorly  Near the center of the stump, then posteriorly to anteriorly through the center of the muscle belly, just posterior to the previously placed knot. The muscle  was drawn up to the level of the original insertion, and all slack was removed. The clamp was removed. The suture ends were tied securely. The portion of the muscle anterior to the sutures was carefully excised. Conjunctiva was closed with 2 6-0 plain gut sutures. The lateral rectus pole, noose, conjunctiva, and  traction sutures were taped to the operative lower lids. Tobradex ophthalmic ointment was placed in the eye. A sterile pad was placed on the. The patient was awakened without difficulty and taken to the recovery room in stable condition, having suffered no intraoperative or immediate postoperative complications.  Derry Skill, MD

## 2015-02-27 ENCOUNTER — Encounter (HOSPITAL_BASED_OUTPATIENT_CLINIC_OR_DEPARTMENT_OTHER): Payer: Self-pay | Admitting: Ophthalmology

## 2015-11-23 ENCOUNTER — Ambulatory Visit (INDEPENDENT_AMBULATORY_CARE_PROVIDER_SITE_OTHER): Payer: Managed Care, Other (non HMO) | Admitting: Neurology

## 2015-11-23 ENCOUNTER — Encounter: Payer: Self-pay | Admitting: Neurology

## 2015-11-23 VITALS — BP 138/82 | HR 82 | Resp 20 | Ht 77.0 in | Wt 350.0 lb

## 2015-11-23 DIAGNOSIS — R351 Nocturia: Secondary | ICD-10-CM | POA: Diagnosis not present

## 2015-11-23 DIAGNOSIS — G4719 Other hypersomnia: Secondary | ICD-10-CM | POA: Diagnosis not present

## 2015-11-23 DIAGNOSIS — R011 Cardiac murmur, unspecified: Secondary | ICD-10-CM

## 2015-11-23 DIAGNOSIS — R0683 Snoring: Secondary | ICD-10-CM | POA: Diagnosis not present

## 2015-11-23 DIAGNOSIS — G478 Other sleep disorders: Secondary | ICD-10-CM

## 2015-11-23 DIAGNOSIS — R609 Edema, unspecified: Secondary | ICD-10-CM | POA: Diagnosis not present

## 2015-11-23 DIAGNOSIS — R0981 Nasal congestion: Secondary | ICD-10-CM

## 2015-11-23 NOTE — Progress Notes (Signed)
Subjective:    Patient ID: Nathan Butler. is a 55 y.o. male.  HPI     Nathan Age, MD, PhD [facility] [address] [place], [place] [address]  Dear Dr. Tammi Klippel,   I saw your patient, Nathan Butler, upon your kind request in my neurologic clinic today for initial consultation of his sleep disorder, in particular, concern for underlying obstructive sleep apnea. The patient is unaccompanied today. As you know, Nathan Butler is a 55 year old right-handed gentleman with an underlying medical history of allergic rhinitis, hypertension, left ventricular hypertrophy, vitamin D deficiency and morbid obesity, who reports snoring and excessive daytime somnolence. I reviewed your office note from 11/20/2015.  He also endorses waking up with a sense of gasping at times. He does not wake up rested. Unfortunately, since he lost his job about 8-1/2 months ago he has gained a tremendous amount of weight, he estimates about 70 pounds. He has just recently started a weight loss endeavor and thus far has lost about 9 pounds. He has an appointment with nutrition tomorrow. He has also been referred to cardiology and it looks like he may be seeing Dr. Einar Gip, but he has not been given an appointment yet. He had some blood work through your office but has not heard back about test results. No blood test results are available for my review today. His bedtime and rise time varies as he has not kept his schedule sense he has been out of work. Usually he will be and then after midnight, sometimes as late as 2 AM. He watches TV in bed. He does try to turn the TV off before falling asleep as he likes to sleep in a dark and cool environment. He lives alone. He has no children. He has no pets. He does not smoke. He drinks 1 glass of red wine daily and 1 cup of coffee daily, typically no sodas. About a week ago he started noticing excess nasal congestion and started using over-the-counter  Afrin twice daily every day for the past week. Generally he is awake in the mornings around 5 AM, it seems like he is awake at 5 AM no matter how late he goes to bed he states. He had to get up for work around 5 AM. He is expecting to restart work in May 2017. He will be working for the same company in a different position. He has nocturia about once or twice per night. His Epworth sleepiness score is 1 out of 24 today, his fatigue score is 23 out of 63.  His Past Medical History Is Significant For: Past Medical History  Diagnosis Date  . Hyperlipidemia   . Hypertension     no meds  . Wears glasses     to read  . Heart disease   . Herpes     His Past Surgical History Is Significant For: Past Surgical History  Procedure Laterality Date  . Wisdom tooth extraction    . Hernia repair Right 1999    [facility]  . Parathyroidectomy Left 05/04/2013    Procedure: inferior PARATHYROIDECTOMY;  Surgeon: Earnstine Regal, MD;  Location: [place];  Service: General;  Laterality: Left;  . Excision metacarpal mass Left 05/04/2013    Procedure: LEFT THUMB MASS EXCISION, FLEXOR TENOLYSIS, ULNAR DIGITAL NERVE NEUROLYSIS;  Surgeon: Roseanne Kaufman, MD;  Location: [place];  Service: Orthopedics;  Laterality: Left;  Marland Kitchen Eye surgery Left 1968  . Colonoscopy    .  Strabismus surgery Left 09/02/2014    Procedure: REPAIR STRABISMUS BILATERAL;  Surgeon: Derry Skill, MD;  Location: [place];  Service: Ophthalmology;  Laterality: Left;  . Strabismus surgery Right 02/24/2015    Procedure: REPAIR STRABISMUS RIGHT EYE;  Surgeon: Everitt Amber, MD;  Location: [place];  Service: Ophthalmology;  Laterality: Right;    His Family History Is Significant For: Family History  Problem Relation Butler of Onset  . Heart failure Father   . Cancer Maternal Grandmother     Ovarian  . Cancer Paternal Grandmother     Colon    His Social History Is Significant For: Social History   Social History  .  Marital Status: Single    Spouse Name: N/A  . Number of Children: N/A  . Years of Education: BA   Occupational History  . N/A    Social History Main Topics  . Smoking status: Never Smoker   . Smokeless tobacco: Never Used  . Alcohol Use: 1.2 oz/week    2 Standard drinks or equivalent per week  . Drug Use: No  . Sexual Activity: Not Asked   Other Topics Concern  . None   Social History Narrative   Drinks 1 cup of coffee a day     His Allergies Are:  No Known Allergies:   His Current Medications Are:  Outpatient Encounter Prescriptions as of 11/23/2015  Medication Sig  . amLODipine (NORVASC) 5 MG tablet   . aspirin 81 MG tablet Take 81 mg by mouth daily.  . Cholecalciferol (VITAMIN D3) 1000 units CAPS Take 1,000 Units by mouth.  . cyanocobalamin 500 MCG tablet Take 1,000 mcg by mouth daily.   . hydrochlorothiazide (MICROZIDE) 12.5 MG capsule   . Omega-3 Fatty Acids (FISH OIL) 1000 MG CAPS Take by mouth.  Marland Kitchen oxymetazoline (AFRIN) 0.05 % nasal spray Place 1 spray into both nostrils 2 (two) times daily.  . [DISCONTINUED] cholecalciferol (VITAMIN D) 1000 UNITS tablet Take 1,000 Units by mouth daily.  . [DISCONTINUED] fluticasone (FLONASE) 50 MCG/ACT nasal spray   . [DISCONTINUED] VIAGRA 100 MG tablet TAKE 1/2 TAB(S) BY MOUTH ONCE A DAY   No facility-administered encounter medications on file as of 11/23/2015.  :  Review of Systems:  Out of a complete 14 point review of systems, all are reviewed and negative with the exception of these symptoms as listed below:   Review of Systems  HENT: Positive for rhinorrhea.   Endocrine: Positive for polydipsia.  Allergic/Immunologic: Positive for environmental allergies.  Neurological:       Patient has trouble staying asleep, snoring, wakes up gasping for air, wakes up feeling tired, takes naps.   Psychiatric/Behavioral:       Not enough sleep, decreased energy, change in appetite    Epworth Sleepiness Scale 0= would never doze 1=  slight chance of dozing 2= moderate chance of dozing 3= high chance of dozing  Sitting and reading:0 Watching TV:0 Sitting inactive in a public place (ex. Theater or meeting):0 As a passenger in a car for an hour without a break:0 Lying down to rest in the afternoon:1 Sitting and talking to someone:0 Sitting quietly after lunch (no alcohol):0 In a car, while stopped in traffic:0 Total:1  Objective:  Neurologic Exam  Physical Exam Physical Examination:   Filed Vitals:   11/23/15 1425  BP: 138/82  Pulse: 82  Resp: 20    General Examination: The patient is a very pleasant 55 y.o. male in no acute distress.  He appears well-developed and well-nourished and well groomed. He is obese.   HEENT: Normocephalic, atraumatic, pupils are equal, round and reactive to light and accommodation. Funduscopic exam is normal with sharp disc margins noted. Extraocular tracking is good without limitation to gaze excursion or nystagmus noted. Normal smooth pursuit is noted. Hearing is grossly intact. Face is symmetric with normal facial animation and normal facial sensation. Speech is clear with no dysarthria noted. There is no hypophonia. There is no lip, neck/head, jaw or voice tremor. Neck is supple with full range of passive and active motion. There are no carotid bruits on auscultation. Oropharynx exam reveals: mild mouth dryness, adequate dental hygiene and moderate airway crowding, due to large tongue, elongated uvula, and tonsils in place. Mallampati is class II. Tongue protrudes centrally and palate elevates symmetrically. Tonsils are 1+ to 2+ in size. Neck size is 17.5 inches. He has an absent overbite. Nasal inspection reveals mild mucosal bogginess, right more than left and mild erythema. No septal deviation.   Chest: Clear to auscultation without wheezing, rhonchi or crackles noted.  Heart: S1+S2+0, regular and normal with a midsystolic murmur, no other abnormal heart sounds.   Abdomen: Soft,  non-tender and non-distended with normal bowel sounds appreciated on auscultation.  Extremities: There is trace pitting edema in the distal lower extremities bilaterally. Pedal pulses are intact.  Skin: Warm and dry without trophic changes noted. There are no varicose veins.  Musculoskeletal: exam reveals no obvious joint deformities, tenderness or joint swelling or erythema.   Neurologically:  Mental status: The patient is awake, alert and oriented in all 4 spheres. His immediate and remote memory, attention, language skills and fund of knowledge are appropriate. There is no evidence of aphasia, agnosia, apraxia or anomia. Speech is clear with normal prosody and enunciation. Thought process is linear. Mood is normal and affect is normal.  Cranial nerves II - XII are as described above under HEENT exam. In addition: shoulder shrug is normal with equal shoulder height noted. Motor exam: Normal bulk, strength and tone is noted. There is no drift, tremor or rebound. Romberg is negative. Reflexes are 2+ throughout. Babinski: Toes are flexor bilaterally. Fine motor skills and coordination: intact with normal finger taps, normal hand movements, normal rapid alternating patting, normal foot taps and normal foot agility.  Cerebellar testing: No dysmetria or intention tremor on finger to nose testing. Heel to shin is unremarkable bilaterally. There is no truncal or gait ataxia.  Sensory exam: intact to light touch, pinprick, vibration, temperature sense in the upper and lower extremities.  Gait, station and balance: He stands easily. No veering to one side is noted. No leaning to one side is noted. Posture is Butler-appropriate and stance is narrow based. Gait shows normal stride length and normal pace. No problems turning are noted. He turns en bloc. Tandem walk is slightly challanginf for him.   Assessment and Plan:  In summary, Driston Aderholt. is a very pleasant 55 y.o.-year old male with an underlying  medical history of allergic rhinitis, hypertension, left ventricular hypertrophy, vitamin D deficiency and morbid obesity, whose history and physical exam are concerning for obstructive sleep apnea (OSA). I had a long chat with the patient about my findings and the diagnosis of OSA, its prognosis and treatment options. We talked about medical treatments, surgical interventions and non-pharmacological approaches. I explained in particular the risks and ramifications of untreated moderate to severe OSA, especially with respect to developing cardiovascular disease down the Road, including congestive  heart failure, difficult to treat hypertension, cardiac arrhythmias, or stroke. Even type 2 diabetes has, in part, been linked to untreated OSA. Symptoms of untreated OSA include daytime sleepiness, memory problems, mood irritability and mood disorder such as depression and anxiety, lack of energy, as well as recurrent headaches, especially morning headaches. We talked about trying to maintain a healthy lifestyle in general, as well as the importance of weight control. I encouraged the patient to eat healthy, exercise daily and keep well hydrated, to keep a scheduled bedtime and wake time routine, to not skip any meals and eat healthy snacks in between meals. I advised the patient not to drive when feeling sleepy. I recommended the following at this time: sleep study with potential positive airway pressure titration. (We will score hypopneas at 4% and split the sleep study into diagnostic and treatment portion, if the estimated. 2 hour AHI is >15/h).   I explained the sleep test procedure to the patient and also outlined possible surgical and non-surgical treatment options of OSA, including the use of a custom-made dental device (which would require a referral to a specialist dentist or oral surgeon - however, patient has absence of overbite), upper airway surgical options, such as pillar implants, radiofrequency  surgery, tongue base surgery, and UPPP (which would involve a referral to an ENT surgeon). Rarely, jaw surgery such as mandibular advancement may be considered.  I also explained the CPAP treatment option to the patient, who indicated that he would be willing to try CPAP if the need arises. I explained the importance of being compliant with PAP treatment, not only for insurance purposes but primarily to improve His symptoms, and for the patient's long term health benefit, including to reduce His cardiovascular risks. I answered all his questions today and the patient was in agreement. I would like to see him back after the sleep study is completed and encouraged him to call with any interim questions, concerns, problems or updates.   Thank you very much for allowing me to participate in the care of this nice patient. If I can be of any further assistance to you please do not hesitate to call me at [phone].  Sincerely,   Nathan Age, MD, PhD

## 2015-11-23 NOTE — Patient Instructions (Signed)

## 2015-11-28 ENCOUNTER — Encounter: Payer: Self-pay | Admitting: *Deleted

## 2016-01-03 ENCOUNTER — Telehealth: Payer: Self-pay

## 2016-01-03 NOTE — Telephone Encounter (Signed)
FYI we received a fax from [facility] stating that patient has refused to complete HST at this time.

## 2016-01-03 NOTE — Telephone Encounter (Signed)
Please look into this, can you clarify, Dawn?

## 2016-01-21 ENCOUNTER — Encounter (HOSPITAL_BASED_OUTPATIENT_CLINIC_OR_DEPARTMENT_OTHER): Payer: Self-pay | Admitting: *Deleted

## 2016-01-21 ENCOUNTER — Emergency Department (HOSPITAL_BASED_OUTPATIENT_CLINIC_OR_DEPARTMENT_OTHER): Payer: Managed Care, Other (non HMO)

## 2016-01-21 ENCOUNTER — Emergency Department (HOSPITAL_BASED_OUTPATIENT_CLINIC_OR_DEPARTMENT_OTHER)
Admission: EM | Admit: 2016-01-21 | Discharge: 2016-01-21 | Disposition: A | Discharge status: Home / Self Care | Payer: Managed Care, Other (non HMO) | Attending: Emergency Medicine | Admitting: Emergency Medicine

## 2016-01-21 DIAGNOSIS — R0602 Shortness of breath: Secondary | ICD-10-CM | POA: Insufficient documentation

## 2016-01-21 DIAGNOSIS — I10 Essential (primary) hypertension: Secondary | ICD-10-CM | POA: Insufficient documentation

## 2016-01-21 DIAGNOSIS — Z7982 Long term (current) use of aspirin: Secondary | ICD-10-CM | POA: Insufficient documentation

## 2016-01-21 DIAGNOSIS — R000 Tachycardia, unspecified: Secondary | ICD-10-CM | POA: Diagnosis present

## 2016-01-21 DIAGNOSIS — Z79899 Other long term (current) drug therapy: Secondary | ICD-10-CM | POA: Insufficient documentation

## 2016-01-21 DIAGNOSIS — E785 Hyperlipidemia, unspecified: Secondary | ICD-10-CM | POA: Insufficient documentation

## 2016-01-21 DIAGNOSIS — R110 Nausea: Secondary | ICD-10-CM | POA: Diagnosis not present

## 2016-01-21 DIAGNOSIS — R079 Chest pain, unspecified: Secondary | ICD-10-CM | POA: Insufficient documentation

## 2016-01-21 DIAGNOSIS — R0600 Dyspnea, unspecified: Secondary | ICD-10-CM

## 2016-01-21 LAB — CBC
HEMATOCRIT: 44.8 % (ref 39.0–52.0)
HEMOGLOBIN: 15.9 g/dL (ref 13.0–17.0)
MCH: 29.9 pg (ref 26.0–34.0)
MCHC: 35.5 g/dL (ref 30.0–36.0)
MCV: 84.4 fL (ref 78.0–100.0)
Platelets: 198 10*3/uL (ref 150–400)
RBC: 5.31 MIL/uL (ref 4.22–5.81)
RDW: 14.2 % (ref 11.5–15.5)
WBC: 11.4 10*3/uL — ABNORMAL HIGH (ref 4.0–10.5)

## 2016-01-21 LAB — COMPREHENSIVE METABOLIC PANEL
ALT: 52 U/L (ref 17–63)
AST: 82 U/L — AB (ref 15–41)
Albumin: 4.4 g/dL (ref 3.5–5.0)
Alkaline Phosphatase: 59 U/L (ref 38–126)
Anion gap: 8 (ref 5–15)
BILIRUBIN TOTAL: 0.6 mg/dL (ref 0.3–1.2)
BUN: 16 mg/dL (ref 6–20)
CALCIUM: 9.1 mg/dL (ref 8.9–10.3)
CO2: 24 mmol/L (ref 22–32)
Chloride: 106 mmol/L (ref 101–111)
Creatinine, Ser: 1.42 mg/dL — ABNORMAL HIGH (ref 0.61–1.24)
GFR calc Af Amer: >60 mL/min (ref >60)
GFR, EST NON AFRICAN AMERICAN: 55 mL/min — AB (ref >60)
GLUCOSE: 125 mg/dL — AB (ref 65–99)
Potassium: 3 mmol/L — ABNORMAL LOW (ref 3.5–5.1)
Sodium: 138 mmol/L (ref 135–145)
TOTAL PROTEIN: 8 g/dL (ref 6.5–8.1)

## 2016-01-21 LAB — TROPONIN I
TROPONIN I: 0.06 ng/mL — AB (ref <0.031)
Troponin I: 0.07 ng/mL — ABNORMAL HIGH (ref <0.031)

## 2016-01-21 NOTE — Discharge Instructions (Signed)
Nonspecific Chest Pain  °Chest pain can be caused by many different conditions. There is always a chance that your pain could be related to something serious, such as a heart attack or a blood clot in your lungs. Chest pain can also be caused by conditions that are not life-threatening. If you have chest pain, it is very important to follow up with your health care provider. °CAUSES  °Chest pain can be caused by: °· Heartburn. °· Pneumonia or bronchitis. °· Anxiety or stress. °· Inflammation around your heart (pericarditis) or lung (pleuritis or pleurisy). °· A blood clot in your lung. °· A collapsed lung (pneumothorax). It can develop suddenly on its own (spontaneous pneumothorax) or from trauma to the chest. °· Shingles infection (varicella-zoster virus). °· Heart attack. °· Damage to the bones, muscles, and cartilage that make up your chest wall. This can include: °¨ Bruised bones due to injury. °¨ Strained muscles or cartilage due to frequent or repeated coughing or overwork. °¨ Fracture to one or more ribs. °¨ Sore cartilage due to inflammation (costochondritis). °RISK FACTORS  °Risk factors for chest pain may include: °· Activities that increase your risk for trauma or injury to your chest. °· Respiratory infections or conditions that cause frequent coughing. °· Medical conditions or overeating that can cause heartburn. °· Heart disease or family history of heart disease. °· Conditions or health behaviors that increase your risk of developing a blood clot. °· Having had chicken pox (varicella zoster). °SIGNS AND SYMPTOMS °Chest pain can feel like: °· Burning or tingling on the surface of your chest or deep in your chest. °· Crushing, pressure, aching, or squeezing pain. °· Dull or sharp pain that is worse when you move, cough, or take a deep breath. °· Pain that is also felt in your back, neck, shoulder, or arm, or pain that spreads to any of these areas. °Your chest pain may come and go, or it may stay  constant. °DIAGNOSIS °Lab tests or other studies may be needed to find the cause of your pain. Your health care provider may have you take a test called an ambulatory ECG (electrocardiogram). An ECG records your heartbeat patterns at the time the test is performed. You may also have other tests, such as: °· Transthoracic echocardiogram (TTE). During echocardiography, sound waves are used to create a picture of all of the heart structures and to look at how blood flows through your heart. °· Transesophageal echocardiogram (TEE). This is a more advanced imaging test that obtains images from inside your body. It allows your health care provider to see your heart in finer detail. °· Cardiac monitoring. This allows your health care provider to monitor your heart rate and rhythm in real time. °· Holter monitor. This is a portable device that records your heartbeat and can help to diagnose abnormal heartbeats. It allows your health care provider to track your heart activity for several days, if needed. °· Stress tests. These can be done through exercise or by taking medicine that makes your heart beat more quickly. °· Blood tests. °· Imaging tests. °TREATMENT  °Your treatment depends on what is causing your chest pain. Treatment may include: °· Medicines. These may include: °¨ Acid blockers for heartburn. °¨ Anti-inflammatory medicine. °¨ Pain medicine for inflammatory conditions. °¨ Antibiotic medicine, if an infection is present. °¨ Medicines to dissolve blood clots. °¨ Medicines to treat coronary artery disease. °· Supportive care for conditions that do not require medicines. This may include: °¨ Resting. °¨ Applying heat   or cold packs to injured areas. °¨ Limiting activities until pain decreases. °HOME CARE INSTRUCTIONS °· If you were prescribed an antibiotic medicine, finish it all even if you start to feel better. °· Avoid any activities that bring on chest pain. °· Do not use any tobacco products, including  cigarettes, chewing tobacco, or electronic cigarettes. If you need help quitting, ask your health care provider. °· Do not drink alcohol. °· Take medicines only as directed by your health care provider. °· Keep all follow-up visits as directed by your health care provider. This is important. This includes any further testing if your chest pain does not go away. °· If heartburn is the cause for your chest pain, you may be told to keep your head raised (elevated) while sleeping. This reduces the chance that acid will go from your stomach into your esophagus. °· Make lifestyle changes as directed by your health care provider. These may include: °¨ Getting regular exercise. Ask your health care provider to suggest some activities that are safe for you. °¨ Eating a heart-healthy diet. A registered dietitian can help you to learn healthy eating options. °¨ Maintaining a healthy weight. °¨ Managing diabetes, if necessary. °¨ Reducing stress. °SEEK MEDICAL CARE IF: °· Your chest pain does not go away after treatment. °· You have a rash with blisters on your chest. °· You have a fever. °SEEK IMMEDIATE MEDICAL CARE IF:  °· Your chest pain is worse. °· You have an increasing cough, or you cough up blood. °· You have severe abdominal pain. °· You have severe weakness. °· You faint. °· You have chills. °· You have sudden, unexplained chest discomfort. °· You have sudden, unexplained discomfort in your arms, back, neck, or jaw. °· You have shortness of breath at any time. °· You suddenly start to sweat, or your skin gets clammy. °· You feel nauseous or you vomit. °· You suddenly feel light-headed or dizzy. °· Your heart begins to beat quickly, or it feels like it is skipping beats. °These symptoms may represent a serious problem that is an emergency. Do not wait to see if the symptoms will go away. Get medical help right away. Call your local emergency services (911 in the [place] Do not drive yourself to the hospital. °  °This  information is not intended to replace advice given to you by your health care provider. Make sure you discuss any questions you have with your health care provider. °  °Document Released: 06/12/2005 Document Revised: 09/23/2014 Document Reviewed: 04/08/2014 °Elsevier Interactive Patient Education ©2016 [employer] ° °

## 2016-01-21 NOTE — ED Notes (Signed)
Pt reports episode of "feeling like I'm going to pass out while lying in bed", VSS, NSR on monitor, occasional PVC noted, strip reviewed, EKG repeated, EDP notified.

## 2016-01-21 NOTE — ED Notes (Signed)
Pt given d/c instructions as per chart. Verbalizes understanding.noquestions.

## 2016-01-21 NOTE — ED Notes (Signed)
VSS, "feeling of syncope abated", pt to xray via stretcher.

## 2016-01-21 NOTE — ED Notes (Signed)
Pt placed on auto vitals Q30. Patient placed on cardiac monitor.  

## 2016-01-21 NOTE — ED Notes (Addendum)
Pt  C/o ""heart racing and SOB" x 30 mins denies CP Pt is wearing a heart monitor

## 2016-01-21 NOTE — ED Notes (Signed)
Dr. Pickering at [facility] ?

## 2016-01-21 NOTE — ED Notes (Signed)
Back from xray, tolerated well, no changes, VSS, alert, NAD, calm, interactive.no dyspnea noted. Family x2 at [facility].

## 2016-01-21 NOTE — ED Provider Notes (Signed)
CSN: [id number]     Arrival date & time 01/21/16  1927 History  By signing my name below, I, Irene Pap, attest that this documentation has been prepared under the direction and in the presence of Davonna Belling, MD. Electronically Signed: Irene Pap, ED Scribe. 01/21/2016. 7:46 PM.  Chief Complaint  Patient presents with  . Tachycardia   The history is provided by the patient. No language interpreter was used.  HPI Comments: Devane Grannan. is a 55 y.o. Male with a hx of HTN and heart disease who presents to the Emergency Department complaining of sudden onset increased heart rate onset 30 minutes ago. Pt complains associated SOB, lightheadedness, nausea, and diaphoresis that has since resolved. Pt began wearing an automatic transmit heart monitor two weeks ago. He states that he has not had any similar episodes since wearing the monitor. He reports a hx of similar symptoms but was never evaluated. He denies chest pain or leg swelling.    Past Medical History  Diagnosis Date  . Hyperlipidemia   . Hypertension     no meds  . Wears glasses     to read  . Heart disease   . Herpes    Past Surgical History  Procedure Laterality Date  . Wisdom tooth extraction    . Hernia repair Right 1999    [facility]  . Parathyroidectomy Left 05/04/2013    Procedure: inferior PARATHYROIDECTOMY;  Surgeon: Earnstine Regal, MD;  Location: [facility];  Service: General;  Laterality: Left;  . Excision metacarpal mass Left 05/04/2013    Procedure: LEFT THUMB MASS EXCISION, FLEXOR TENOLYSIS, ULNAR DIGITAL NERVE NEUROLYSIS;  Surgeon: Roseanne Kaufman, MD;  Location: [facility];  Service: Orthopedics;  Laterality: Left;  Marland Kitchen Eye surgery Left 1968  . Colonoscopy    . Strabismus surgery Left 09/02/2014    Procedure: REPAIR STRABISMUS BILATERAL;  Surgeon: Derry Skill, MD;  Location: [facility];  Service: Ophthalmology;  Laterality: Left;  . Strabismus surgery Right 02/24/2015    Procedure: REPAIR STRABISMUS  RIGHT EYE;  Surgeon: Everitt Amber, MD;  Location: [place];  Service: Ophthalmology;  Laterality: Right;   Family History  Problem Relation Age of Onset  . Heart failure Father   . Cancer Maternal Grandmother     Ovarian  . Cancer Paternal Grandmother     Colon   Social History  Substance Use Topics  . Smoking status: Never Smoker   . Smokeless tobacco: Never Used  . Alcohol Use: 1.2 oz/week    2 Standard drinks or equivalent per week    Review of Systems  Constitutional: Positive for diaphoresis.  Respiratory: Positive for shortness of breath.   Cardiovascular: Positive for palpitations. Negative for chest pain and leg swelling.  Gastrointestinal: Positive for nausea.  Neurological: Positive for light-headedness.  All other systems reviewed and are negative.  Allergies  Review of patient's allergies indicates no known allergies.  Home Medications   Prior to Admission medications   Medication Sig Start Date End Date Taking? Authorizing Provider  aspirin 81 MG tablet Take 81 mg by mouth daily.   Yes Historical Provider, MD  Cholecalciferol (VITAMIN D3) 1000 units CAPS Take 1,000 Units by mouth.   Yes Historical Provider, MD  cyanocobalamin 500 MCG tablet Take 1,000 mcg by mouth daily.    Yes Historical Provider, MD  lisinopril (PRINIVIL,ZESTRIL) 10 MG tablet Take 10 mg by mouth daily.   Yes Historical Provider, MD  metoprolol tartrate (LOPRESSOR) 25 MG  tablet Take 25 mg by mouth 2 (two) times daily.   Yes Historical Provider, MD  Omega-3 Fatty Acids (FISH OIL) 1000 MG CAPS Take by mouth.   Yes Historical Provider, MD  hydrochlorothiazide (MICROZIDE) 12.5 MG capsule  11/20/15   Historical Provider, MD  oxymetazoline (AFRIN) 0.05 % nasal spray Place 1 spray into both nostrils 2 (two) times daily.    Historical Provider, MD   BP 121/75 mmHg  Pulse 83  Temp(Src) 98 F (36.7 C) (Oral)  Resp 28  Ht 6\' 5"  (1.956 m)  Wt 347 lb (157.398 kg)  BMI 41.14 kg/m2   SpO2 97% Physical Exam  Constitutional: He is oriented to person, place, and time. He appears well-developed and well-nourished.  HENT:  Head: Normocephalic and atraumatic.  Eyes: EOM are normal. Pupils are equal, round, and reactive to light.  Neck: Normal range of motion. Neck supple.  Cardiovascular: Normal rate, regular rhythm and normal heart sounds.  Exam reveals no gallop and no friction rub.   No murmur heard. Pulmonary/Chest: Effort normal and breath sounds normal. He has no wheezes.  Abdominal: Soft. There is no tenderness.  Musculoskeletal: Normal range of motion.  Neurological: He is alert and oriented to person, place, and time.  Skin: Skin is warm and dry.  Psychiatric: He has a normal mood and affect. His behavior is normal.  Nursing note and vitals reviewed.   ED Course  Procedures (including critical care time) DIAGNOSTIC STUDIES: Oxygen Saturation is 99% on RA, normal by my interpretation.    COORDINATION OF CARE: 7:43 PM-Discussed treatment plan which includes x-ray and labs with pt at bedside and pt agreed to plan.    Labs Review Labs Reviewed  TROPONIN I - Abnormal; Notable for the following:    Troponin I 0.06 (*)    All other components within normal limits  CBC - Abnormal; Notable for the following:    WBC 11.4 (*)    All other components within normal limits  COMPREHENSIVE METABOLIC PANEL - Abnormal; Notable for the following:    Potassium 3.0 (*)    Glucose, Bld 125 (*)    Creatinine, Ser 1.42 (*)    AST 82 (*)    GFR calc non Af Amer 55 (*)    All other components within normal limits  TROPONIN I - Abnormal; Notable for the following:    Troponin I 0.07 (*)    All other components within normal limits    Imaging Review Dg Chest 2 View  01/21/2016  CLINICAL DATA:  Chest pain EXAM: CHEST  2 VIEW COMPARISON:  04/27/2013 FINDINGS: Cardiomegaly. There is cephalized blood flow and congested appearance of the vessels. No edema or effusion. No  consolidation. Negative aortic and hilar contours. No pneumothorax. IMPRESSION: Cardiomegaly and pulmonary venous congestion. Electronically Signed   By: Monte Fantasia M.D.   On: 01/21/2016 20:48   I have personally reviewed and evaluated these images and lab results as part of my medical decision-making.   EKG Interpretation   Date/Time:  Sunday Jan 21 2016 20:08:45 EDT Ventricular Rate:  91 PR Interval:  181 QRS Duration: 97 QT Interval:  347 QTC Calculation: 427 R Axis:   68 Text Interpretation:  Sinus rhythm Probable LVH with secondary repol abnrm  Anterior Q waves, possibly due to LVH ST depr, consider ischemia, inferior  leads unchanged from EKG earlier today.  Confirmed by Alvino Chapel  MD,  Ovid Curd [phone]) on 01/21/2016 8:45:36 PM      MDM  Final diagnoses:  Dyspnea  Chest pain, unspecified chest pain type    Patient presents with shortness of breath and chest pain. Had also some palpitations. Is currently wearing a heart monitor. His cardiologist Dr. Royal Piedra, with who I discussed the case. EKG here showed LVH with re-pole. Change from prior here but reviewed with Dr. Timmothy Sours she states this is how it looked one month ago. In the last month he has had a negative stress test and normal echo. Troponin is minimally elevated at 0.06 and repeated at about the same level. Will discharge home. Work appear overall unremarkable. Did have rare PVCs on monitoring. Had an episode where he felt the lightheadedness and his heart racing was still sinus rhythm on the EKG. I personally performed the services described in this documentation, which was scribed in my presence. The recorded information has been reviewed and is accurate.      Davonna Belling, MD 01/21/16 [phone]

## 2016-01-21 NOTE — ED Notes (Addendum)
Pt seen by EDP prior to RN assessment, see MD notes, orders received and initiated. Pt alert, NAD, calm, interactive, resps e/u, no dyspnea noted, c/o chest palpitations and sob at this time, also reports earlier nausea, and blurred vision (resolved), onset while sitting ~ 1 hr ago.

## 2017-01-14 DEATH — deceased
